# Patient Record
Sex: Female | Born: 1940 | Race: Black or African American | Hispanic: No | Smoking: Former smoker
Health system: Southern US, Community
[De-identification: ages and names within clinical notes are randomized; demographics above are authoritative.]

## PROBLEM LIST (undated history)

## (undated) DIAGNOSIS — I639 Cerebral infarction, unspecified: Secondary | ICD-10-CM

## (undated) DIAGNOSIS — N189 Chronic kidney disease, unspecified: Secondary | ICD-10-CM

## (undated) DIAGNOSIS — M4802 Spinal stenosis, cervical region: Secondary | ICD-10-CM

## (undated) DIAGNOSIS — R269 Unspecified abnormalities of gait and mobility: Secondary | ICD-10-CM

## (undated) DIAGNOSIS — F0280 Dementia in other diseases classified elsewhere without behavioral disturbance: Secondary | ICD-10-CM

## (undated) DIAGNOSIS — G4700 Insomnia, unspecified: Secondary | ICD-10-CM

## (undated) HISTORY — PX: ABDOMINAL HYSTERECTOMY: SHX81

---

## 2005-05-17 HISTORY — PX: COLON SURGERY: SHX602

## 2016-01-26 DIAGNOSIS — I10 Essential (primary) hypertension: Secondary | ICD-10-CM | POA: Insufficient documentation

## 2016-01-26 DIAGNOSIS — K219 Gastro-esophageal reflux disease without esophagitis: Secondary | ICD-10-CM | POA: Insufficient documentation

## 2016-01-26 DIAGNOSIS — G309 Alzheimer's disease, unspecified: Secondary | ICD-10-CM | POA: Insufficient documentation

## 2016-01-26 DIAGNOSIS — R55 Syncope and collapse: Secondary | ICD-10-CM | POA: Insufficient documentation

## 2016-01-26 DIAGNOSIS — E785 Hyperlipidemia, unspecified: Secondary | ICD-10-CM | POA: Insufficient documentation

## 2016-01-26 DIAGNOSIS — N390 Urinary tract infection, site not specified: Secondary | ICD-10-CM | POA: Insufficient documentation

## 2016-01-27 DIAGNOSIS — Z7189 Other specified counseling: Secondary | ICD-10-CM | POA: Insufficient documentation

## 2016-01-27 DIAGNOSIS — E538 Deficiency of other specified B group vitamins: Secondary | ICD-10-CM | POA: Insufficient documentation

## 2016-01-27 DIAGNOSIS — Z5111 Encounter for antineoplastic chemotherapy: Secondary | ICD-10-CM | POA: Insufficient documentation

## 2020-08-15 DIAGNOSIS — Z8616 Personal history of COVID-19: Secondary | ICD-10-CM

## 2020-08-15 HISTORY — DX: Personal history of COVID-19: Z86.16

## 2020-08-19 ENCOUNTER — Emergency Department (HOSPITAL_COMMUNITY): Payer: Medicare PPO

## 2020-08-19 ENCOUNTER — Other Ambulatory Visit: Payer: Self-pay

## 2020-08-19 ENCOUNTER — Emergency Department (HOSPITAL_COMMUNITY)
Admission: EM | Admit: 2020-08-19 | Discharge: 2020-08-21 | Disposition: A | Discharge status: Home / Self Care | Payer: Medicare PPO | Attending: Emergency Medicine | Admitting: Emergency Medicine

## 2020-08-19 ENCOUNTER — Encounter (HOSPITAL_COMMUNITY): Payer: Self-pay | Admitting: Emergency Medicine

## 2020-08-19 DIAGNOSIS — F0391 Unspecified dementia with behavioral disturbance: Secondary | ICD-10-CM | POA: Insufficient documentation

## 2020-08-19 DIAGNOSIS — W19XXXA Unspecified fall, initial encounter: Secondary | ICD-10-CM | POA: Diagnosis not present

## 2020-08-19 DIAGNOSIS — Z87891 Personal history of nicotine dependence: Secondary | ICD-10-CM | POA: Insufficient documentation

## 2020-08-19 DIAGNOSIS — R262 Difficulty in walking, not elsewhere classified: Secondary | ICD-10-CM

## 2020-08-19 DIAGNOSIS — Z85038 Personal history of other malignant neoplasm of large intestine: Secondary | ICD-10-CM | POA: Diagnosis not present

## 2020-08-19 DIAGNOSIS — I10 Essential (primary) hypertension: Secondary | ICD-10-CM | POA: Insufficient documentation

## 2020-08-19 DIAGNOSIS — R2689 Other abnormalities of gait and mobility: Secondary | ICD-10-CM | POA: Diagnosis not present

## 2020-08-19 DIAGNOSIS — U071 COVID-19: Secondary | ICD-10-CM | POA: Insufficient documentation

## 2020-08-19 DIAGNOSIS — Z79899 Other long term (current) drug therapy: Secondary | ICD-10-CM | POA: Insufficient documentation

## 2020-08-19 DIAGNOSIS — Y92003 Bedroom of unspecified non-institutional (private) residence as the place of occurrence of the external cause: Secondary | ICD-10-CM | POA: Diagnosis not present

## 2020-08-19 DIAGNOSIS — R296 Repeated falls: Secondary | ICD-10-CM | POA: Insufficient documentation

## 2020-08-19 DIAGNOSIS — F0390 Unspecified dementia without behavioral disturbance: Secondary | ICD-10-CM

## 2020-08-19 HISTORY — DX: Dementia in other diseases classified elsewhere, unspecified severity, without behavioral disturbance, psychotic disturbance, mood disturbance, and anxiety: F02.80

## 2020-08-19 LAB — COMPREHENSIVE METABOLIC PANEL
ALT: 17 U/L (ref 0–44)
AST: 42 U/L — ABNORMAL HIGH (ref 15–41)
Albumin: 4.1 g/dL (ref 3.5–5.0)
Alkaline Phosphatase: 61 U/L (ref 38–126)
Anion gap: 9 (ref 5–15)
BUN: 20 mg/dL (ref 8–23)
CO2: 28 mmol/L (ref 22–32)
Calcium: 10.7 mg/dL — ABNORMAL HIGH (ref 8.9–10.3)
Chloride: 103 mmol/L (ref 98–111)
Creatinine, Ser: 1.23 mg/dL — ABNORMAL HIGH (ref 0.44–1.00)
GFR, Estimated: 45 mL/min — ABNORMAL LOW (ref >60)
Glucose, Bld: 116 mg/dL — ABNORMAL HIGH (ref 70–99)
Potassium: 3.9 mmol/L (ref 3.5–5.1)
Sodium: 140 mmol/L (ref 135–145)
Total Bilirubin: 1.3 mg/dL — ABNORMAL HIGH (ref 0.3–1.2)
Total Protein: 8.2 g/dL — ABNORMAL HIGH (ref 6.5–8.1)

## 2020-08-19 LAB — CBC WITH DIFFERENTIAL/PLATELET
Abs Immature Granulocytes: 0.05 10*3/uL (ref 0.00–0.07)
Basophils Absolute: 0 10*3/uL (ref 0.0–0.1)
Basophils Relative: 0 %
Eosinophils Absolute: 0 10*3/uL (ref 0.0–0.5)
Eosinophils Relative: 0 %
HCT: 41.2 % (ref 36.0–46.0)
Hemoglobin: 13.9 g/dL (ref 12.0–15.0)
Immature Granulocytes: 1 %
Lymphocytes Relative: 16 %
Lymphs Abs: 1.7 10*3/uL (ref 0.7–4.0)
MCH: 28.4 pg (ref 26.0–34.0)
MCHC: 33.7 g/dL (ref 30.0–36.0)
MCV: 84.3 fL (ref 80.0–100.0)
Monocytes Absolute: 1.6 10*3/uL — ABNORMAL HIGH (ref 0.1–1.0)
Monocytes Relative: 15 %
Neutro Abs: 7.5 10*3/uL (ref 1.7–7.7)
Neutrophils Relative %: 68 %
Platelets: 226 10*3/uL (ref 150–400)
RBC: 4.89 MIL/uL (ref 3.87–5.11)
RDW: 16.2 % — ABNORMAL HIGH (ref 11.5–15.5)
WBC: 10.9 10*3/uL — ABNORMAL HIGH (ref 4.0–10.5)
nRBC: 0 % (ref 0.0–0.2)

## 2020-08-19 LAB — URINALYSIS, ROUTINE W REFLEX MICROSCOPIC
Bacteria, UA: NONE SEEN
Bilirubin Urine: NEGATIVE
Glucose, UA: NEGATIVE mg/dL
Ketones, ur: 5 mg/dL — AB
Leukocytes,Ua: NEGATIVE
Nitrite: NEGATIVE
Protein, ur: 30 mg/dL — AB
Specific Gravity, Urine: 1.014 (ref 1.005–1.030)
pH: 5 (ref 5.0–8.0)

## 2020-08-19 MED ORDER — SODIUM CHLORIDE 0.9 % IV BOLUS: 1000.0000 mL | Freq: Once | INTRAVENOUS | Status: DC

## 2020-08-19 MED ORDER — LORAZEPAM 1 MG PO TABS
1.0000 mg | ORAL_TABLET | Freq: Every day | ORAL | Status: DC
  Administered 2020-08-20 – 2020-08-21 (×2): 1 mg via ORAL
  Filled 2020-08-19 (×2): qty 1

## 2020-08-19 MED ORDER — HYDROCHLOROTHIAZIDE 25 MG PO TABS
25.0000 mg | ORAL_TABLET | Freq: Every day | ORAL | Status: DC
  Administered 2020-08-20 – 2020-08-21 (×2): 25 mg via ORAL
  Filled 2020-08-19 (×2): qty 1

## 2020-08-19 MED ORDER — DIVALPROEX SODIUM ER 500 MG PO TB24
500.0000 mg | ORAL_TABLET | Freq: Every day | ORAL | Status: DC
  Administered 2020-08-20 – 2020-08-21 (×2): 500 mg via ORAL
  Filled 2020-08-19 (×2): qty 1

## 2020-08-19 MED ORDER — OLANZAPINE 5 MG PO TABS
5.0000 mg | ORAL_TABLET | Freq: Every day | ORAL | Status: DC
  Administered 2020-08-20 (×2): 5 mg via ORAL
  Filled 2020-08-19 (×2): qty 1

## 2020-08-19 MED ORDER — DONEPEZIL HCL 5 MG PO TABS
5.0000 mg | ORAL_TABLET | Freq: Every day | ORAL | Status: DC
  Administered 2020-08-20 (×2): 5 mg via ORAL
  Filled 2020-08-19 (×2): qty 1

## 2020-08-19 MED ORDER — LISINOPRIL 20 MG PO TABS
20.0000 mg | ORAL_TABLET | Freq: Every day | ORAL | Status: DC
  Administered 2020-08-20 – 2020-08-21 (×2): 20 mg via ORAL
  Filled 2020-08-19 (×2): qty 1

## 2020-08-19 MED ORDER — LISINOPRIL-HYDROCHLOROTHIAZIDE 20-25 MG PO TABS: 1.0000 | ORAL_TABLET | Freq: Every day | ORAL | Status: DC

## 2020-08-19 NOTE — ED Provider Notes (Signed)
[place] DEPT Provider Note   CSN: [id number] Arrival date & time: 08/19/20  1534     History Chief Complaint  Patient presents with  . Fall    Traci Powell is a 80 y.o. female.  Pt presents to the ED today for multiple falls.  Pt has a hx of dementia and her family member gives the hx.  Pt fell on Sunday, 4/3 and today.  Today, she fell in her bedroom and was on the ground about 1-2 hours before the family member realized it.  Pt has not been wanting to walk since she fell today.          Past Medical History:  Diagnosis Date  . Alzheimer's dementia [facility])     Past Medical History:  Diagnosis Date  . Colon cancer (CMS/HCC) ([place])  . Dementia  . GERD (gastroesophageal reflux disease)  . Hyperlipidemia  . Hypertension    SurgHx:  ABDOMINAL SURGERY  . HYSTERECTOMY  . TONSILLECTOMY SURG HX    OB History   No obstetric history on file.     No family history on file.    Family History  Problem Relation Age of Onset  . Hyperlipidemia Mother  . Heart disease Father  . Diabetes Father   Tobacco Use  . Smoking status: Former Smoker  Last attempt to quit: 01/24/2005  Years since quitting: 12.9  . Smokeless tobacco: Never Used  Substance Use Topics  . Alcohol use: No  . Drug use: No      Home Medications Prior to Admission medications   Medication Sig Start Date End Date Taking? Authorizing Provider  divalproex (DEPAKOTE ER) 500 MG 24 hr tablet Take 500 mg by mouth daily.   Yes [provider]  donepezil (ARICEPT) 5 MG tablet Take 5 mg by mouth at bedtime.   Yes [provider]  lisinopril-hydrochlorothiazide (ZESTORETIC) 20-25 MG tablet Take 1 tablet by mouth daily. 01/07/16  Yes [provider]  LORazepam (ATIVAN) 1 MG tablet Take 1 mg by mouth daily.   Yes [provider]  OLANZapine (ZYPREXA) 5 MG tablet Take 5 mg by mouth at bedtime.   Yes [provider]  Vitamin D,  Ergocalciferol, (DRISDOL) 1.25 MG (50000 UNIT) CAPS capsule Take 50,000 Units by mouth every 7 (seven) days.   Yes [provider]    Allergies    Patient has no known allergies.  Review of Systems   Review of Systems  Neurological: Positive for weakness.  All other systems reviewed and are negative.   Physical Exam Updated Vital Signs BP (!) 164/90 (BP Location: Right Arm)   Pulse 66   Temp 98 F (36.7 C) (Oral)   Resp 16   SpO2 100%   Physical Exam Vitals and nursing note reviewed.  Constitutional:      Appearance: Normal appearance.  HENT:     Head: Normocephalic and atraumatic.     Right Ear: External ear normal.     Left Ear: External ear normal.     Nose: Nose normal.     Mouth/Throat:     Mouth: Mucous membranes are moist.     Pharynx: Oropharynx is clear.  Eyes:     Extraocular Movements: Extraocular movements intact.     Conjunctiva/sclera: Conjunctivae normal.     Pupils: Pupils are equal, round, and reactive to light.  Cardiovascular:     Rate and Rhythm: Normal rate and regular rhythm.     Pulses: Normal pulses.  Heart sounds: Normal heart sounds.  Pulmonary:     Effort: Pulmonary effort is normal.     Breath sounds: Normal breath sounds.  Abdominal:     General: Abdomen is flat. Bowel sounds are normal.     Palpations: Abdomen is soft.  Musculoskeletal:        General: Normal range of motion.     Cervical back: Normal range of motion and neck supple.  Skin:    General: Skin is warm.     Capillary Refill: Capillary refill takes less than 2 seconds.  Neurological:     Mental Status: She is alert. Mental status is at baseline.  Psychiatric:        Mood and Affect: Mood normal.        Behavior: Behavior normal.     ED Results / Procedures / Treatments   Labs (all labs ordered are listed, but only abnormal results are displayed) Labs Reviewed  CBC WITH DIFFERENTIAL/PLATELET - Abnormal; Notable for the following components:       Result Value   WBC 10.9 (*)    RDW 16.2 (*)    Monocytes Absolute 1.6 (*)    All other components within normal limits  URINALYSIS, ROUTINE W REFLEX MICROSCOPIC - Abnormal; Notable for the following components:   Hgb urine dipstick MODERATE (*)    Ketones, ur 5 (*)    Protein, ur 30 (*)    All other components within normal limits  COMPREHENSIVE METABOLIC PANEL - Abnormal; Notable for the following components:   Glucose, Bld 116 (*)    Creatinine, Ser 1.23 (*)    Calcium 10.7 (*)    Total Protein 8.2 (*)    AST 42 (*)    Total Bilirubin 1.3 (*)    GFR, Estimated 45 (*)    All other components within normal limits  VALPROIC ACID LEVEL    EKG None  Radiology CT Head Wo Contrast  Result Date: 08/19/2020 CLINICAL DATA:  Found on the floor today. EXAM: CT HEAD WITHOUT CONTRAST CT CERVICAL SPINE WITHOUT CONTRAST TECHNIQUE: Multidetector CT imaging of the head and cervical spine was performed following the standard protocol without intravenous contrast. Multiplanar CT image reconstructions of the cervical spine were also generated. COMPARISON:  None. FINDINGS: CT HEAD FINDINGS Brain: No sign of acute infarction. There is age related volume loss with chronic small-vessel ischemic changes of the hemispheric white matter. No mass, hemorrhage, hydrocephalus or extra-axial collection. Vascular: There is atherosclerotic calcification of the major vessels at the base of the brain. Skull: Negative Sinuses/Orbits: Clear/normal Other: None CT CERVICAL SPINE FINDINGS Alignment: Straightening of the normal cervical lordosis. Skull base and vertebrae: No evidence of acute fracture or subluxation. Chronic fusion from C4 through C6. Soft tissues and spinal canal: No traumatic soft tissue finding. No evidence of mass or adenopathy. Disc levels: Foramen magnum is widely patent. Ordinary osteoarthritis at the C1-2 articulation without encroachment upon the neural structures. C2-3: Endplate osteophytes and mild  bulging of the disc. Facet osteoarthritis worse on the left. Bilateral bony foraminal narrowing left more than right. C3-4: Spondylosis with endplate osteophytes and bulging of the disc. Bilateral facet osteoarthritis. Bilateral foraminal stenosis. C4 through C6: Solid union with sufficient patency of the canal and foramina. C6-7: Chronic disc space narrowing. Endplate osteophytes. Moderate canal stenosis at this level. Bilateral foraminal stenosis. C7-T1: Bilateral facet arthropathy.  Bilateral foraminal stenosis. Upper thoracic region appears unremarkable. Upper chest: Negative Other: None IMPRESSION: HEAD CT: No acute or traumatic finding. Age  related atrophy and chronic small-vessel ischemic changes. CERVICAL SPINE CT: No acute or traumatic finding. Chronic fusion from C4 through C6 with sufficient patency of the canal and foramina. Degenerative spondylosis and facet arthropathy with foraminal stenosis as outlined above. Canal stenosis at the C6-7 level. Electronically Signed   By: Nelson Chimes M.D.   On: 08/19/2020 16:36   CT Cervical Spine Wo Contrast  Result Date: 08/19/2020 CLINICAL DATA:  Found on the floor today. EXAM: CT HEAD WITHOUT CONTRAST CT CERVICAL SPINE WITHOUT CONTRAST TECHNIQUE: Multidetector CT imaging of the head and cervical spine was performed following the standard protocol without intravenous contrast. Multiplanar CT image reconstructions of the cervical spine were also generated. COMPARISON:  None. FINDINGS: CT HEAD FINDINGS Brain: No sign of acute infarction. There is age related volume loss with chronic small-vessel ischemic changes of the hemispheric white matter. No mass, hemorrhage, hydrocephalus or extra-axial collection. Vascular: There is atherosclerotic calcification of the major vessels at the base of the brain. Skull: Negative Sinuses/Orbits: Clear/normal Other: None CT CERVICAL SPINE FINDINGS Alignment: Straightening of the normal cervical lordosis. Skull base and vertebrae:  No evidence of acute fracture or subluxation. Chronic fusion from C4 through C6. Soft tissues and spinal canal: No traumatic soft tissue finding. No evidence of mass or adenopathy. Disc levels: Foramen magnum is widely patent. Ordinary osteoarthritis at the C1-2 articulation without encroachment upon the neural structures. C2-3: Endplate osteophytes and mild bulging of the disc. Facet osteoarthritis worse on the left. Bilateral bony foraminal narrowing left more than right. C3-4: Spondylosis with endplate osteophytes and bulging of the disc. Bilateral facet osteoarthritis. Bilateral foraminal stenosis. C4 through C6: Solid union with sufficient patency of the canal and foramina. C6-7: Chronic disc space narrowing. Endplate osteophytes. Moderate canal stenosis at this level. Bilateral foraminal stenosis. C7-T1: Bilateral facet arthropathy.  Bilateral foraminal stenosis. Upper thoracic region appears unremarkable. Upper chest: Negative Other: None IMPRESSION: HEAD CT: No acute or traumatic finding. Age related atrophy and chronic small-vessel ischemic changes. CERVICAL SPINE CT: No acute or traumatic finding. Chronic fusion from C4 through C6 with sufficient patency of the canal and foramina. Degenerative spondylosis and facet arthropathy with foraminal stenosis as outlined above. Canal stenosis at the C6-7 level. Electronically Signed   By: Nelson Chimes M.D.   On: 08/19/2020 16:36   DG Femur Min 2 Views Left  Result Date: 08/19/2020 CLINICAL DATA:  Left leg pain after fall. EXAM: LEFT FEMUR 2 VIEWS COMPARISON:  None. FINDINGS: There is no evidence of fracture or other focal bone lesions. Soft tissues are unremarkable. Severe degenerative changes seen involving the left hip joint. IMPRESSION: No acute abnormality seen in the left femur. Electronically Signed   By: Marijo Conception M.D.   On: 08/19/2020 16:46    Procedures Procedures   Medications Ordered in ED Medications  sodium chloride 0.9 % bolus 1,000  mL (1,000 mLs Intravenous Not Given 08/19/20 2156)  divalproex (DEPAKOTE ER) 24 hr tablet 500 mg (has no administration in time range)  donepezil (ARICEPT) tablet 5 mg (has no administration in time range)  LORazepam (ATIVAN) tablet 1 mg (has no administration in time range)  OLANZapine (ZYPREXA) tablet 5 mg (has no administration in time range)  lisinopril (ZESTRIL) tablet 20 mg (has no administration in time range)  hydrochlorothiazide (HYDRODIURIL) tablet 25 mg (has no administration in time range)    ED Course  I have reviewed the triage vital signs and the nursing notes.  Pertinent labs & imaging results that were  available during my care of the patient were reviewed by me and considered in my medical decision making (see chart for details).    MDM Rules/Calculators/A&P                          I doubt pt had a stroke.  MRI is gone now.  I offered transfer to [facility] for a [facility] tonight, but the daughter would rather she stay here and wait until the morning.  Sx started Sunday, so doing the MRI a few hrs later will not matter.  So, I ordered it to be done in the morning.  Pt's daughter said she is unable to take care of pt at her home.  She requests SW consult to help with placement in an assisted living facility.  I will consult PT and SW/CM for assistance.  Final Clinical Impression(s) / ED Diagnoses Final diagnoses:  Fall, initial encounter  Ambulatory dysfunction    Rx / DC Orders ED Discharge Orders    None       Isla Pence, MD 08/19/20 2352

## 2020-08-19 NOTE — ED Triage Notes (Signed)
Per family member, states history of dementia-states increased falls, found on floor this am-increased lethargy-complaining of left knee pain

## 2020-08-19 NOTE — ED Triage Notes (Signed)
Emergency Medicine Provider Triage Evaluation Note  Traci Powell , a 80 y.o. female  was evaluated in triage.  Pt complains of dementia, confusion, multiple falls. She was on the floor today for up to 2 hours. Visitor at bedside says that since the weekend she has been more confused than usual and not walking.  She has been complaining of pain in her right leg.  No other reported pains. Patient denies any pain. Unsure if she struck her head on the floor at any point when she fell.Marland Kitchen   Physical Exam  There were no vitals taken for this visit. Patient is awake and alert.  She is able to tell me what her name is.  She follows commands in all 4 extremities.  She is in no obvious distress.  She has mild edema on the right wrist which is chronic according to visitor.  Medical Decision Making  Medically screening exam initiated at 3:50 PM.  Appropriate orders placed.  Traci Powell was informed that the remainder of the evaluation will be completed by another provider, this initial triage assessment does not replace that evaluation, and the importance of remaining in the ED until their evaluation is complete.    Lorin Glass, [place] 08/19/20 1551

## 2020-08-19 NOTE — ED Notes (Signed)
Pt's daughter identified multiple bruises, and noted very recent (2 days) worsening of dementia level.

## 2020-08-20 ENCOUNTER — Emergency Department (HOSPITAL_COMMUNITY): Payer: Medicare PPO

## 2020-08-20 LAB — VALPROIC ACID LEVEL: Valproic Acid Lvl: 24 ug/mL — ABNORMAL LOW (ref 50.0–100.0)

## 2020-08-20 NOTE — Evaluation (Signed)
Physical Therapy Evaluation Patient Details Name: Traci Powell MRN: [id number] DOB: 1941-02-20 Today's Date: 08/20/2020   History of Present Illness  Traci Powell is a 80 y.o. female presents due to multiple falls, one Sunday 4/3 and one 4/5 and hasn't been able to ambulate since. Head CT negative for acute or traumatic finding, L femur xray negative. PMH: alzhemier's dementia, colon cancer, HTN  Clinical Impression  Pt admitted with above diagnosis. Unsure of pt's baseline, pt oriented to self and states she has never fallen but presents to ED due to 2 falls in last week. Pt currently requiring mod A with bed mobility, multimodal cues with mobility and increased time. Pt requiring min A to power up to standing, bracing BLE on front of bed and limited to 3 unsteady steps up to [facility] with bed positioned behind pt and flexed/squatted standing posture. Pt active in conversation with therapist, noted to begin chewing after therapist in room for ~15 minutes and had food in mouth, unable to swallow with cues, so spit into napkin- RN notified. Will continue to assess DME needs pending equipment at home. Pt currently with functional limitations due to the deficits listed below (see PT Problem List). Pt will benefit from skilled PT to increase their independence and safety with mobility to allow discharge to the venue listed below.       Follow Up Recommendations SNF    Equipment Recommendations  Other (comment) (pending home DME)    Recommendations for Other Services Speech consult (swallowing consult)     Precautions / Restrictions Precautions Precautions: Fall Restrictions Weight Bearing Restrictions: No      Mobility  Bed Mobility Overal bed mobility: Needs Assistance Bed Mobility: Supine to Sit;Sit to Supine  Supine to sit: Mod assist Sit to supine: Mod assist   General bed mobility comments: mod A to upright trunk into sitting and scoot out to EOB, mod A to lift BLE back into  bed and position into center of bed    Transfers Overall transfer level: Needs assistance Equipment used: 1 person hand held assist Transfers: Sit to/from Stand Sit to Stand: Min assist    General transfer comment: min A to power up, BUE assisting, BLE braced against front of bed  Ambulation/Gait Ambulation/Gait assistance: Mod assist  Assistive device: 1 person hand held assist  General Gait Details: pt able to take 3 sidesteps up to EOB with bed positioned behind pt, unsteady, bil knee flexion  Stairs            Wheelchair Mobility    Modified Rankin (Stroke Patients Only)       Balance Overall balance assessment: Needs assistance;History of Falls Sitting-balance support: Feet supported;Bilateral upper extremity supported Sitting balance-Leahy Scale: Poor Sitting balance - Comments: reliant on UE support   Standing balance support: During functional activity;Bilateral upper extremity supported Standing balance-Leahy Scale: Poor Standing balance comment: mod A, reliant on UE support        Pertinent Vitals/Pain Pain Assessment: No/denies pain    Home Living Family/patient expects to be discharged to:: Assisted living (per chart review, daughter wants ALF)  Additional Comments: Pt oriented to self only, unreliable responses    Prior Function Level of Independence: Independent  Comments: Pt states independent without AD, denies any falls ever, though is in ED due to recent falls so unreliable responses     Hand Dominance        Extremity/Trunk Assessment   Upper Extremity Assessment Upper Extremity Assessment: Generalized weakness  Lower Extremity Assessment Lower Extremity Assessment: Generalized weakness (AROM WNL, symmetrical, denies numbness/tingling throughout)    Cervical / Trunk Assessment Cervical / Trunk Assessment: Kyphotic  Communication   Communication: No difficulties  Cognition Arousal/Alertness: Awake/alert Behavior During  Therapy: WFL for tasks assessed/performed Overall Cognitive Status: History of cognitive impairments - at baseline    General Comments: Pt states year is "71 something", age is "26", and has 3 kids at home in highschool and college.      General Comments      Exercises     Assessment/Plan    PT Assessment Patient needs continued PT services  PT Problem List Decreased strength;Decreased activity tolerance;Decreased balance;Decreased mobility;Decreased cognition;Decreased knowledge of use of DME;Decreased safety awareness       PT Treatment Interventions DME instruction;Gait training;Functional mobility training;Therapeutic activities;Therapeutic exercise;Balance training;Neuromuscular re-education;Patient/family education    PT Goals (Current goals can be found in the Care Plan section)  Acute Rehab PT Goals Patient Stated Goal: none stated PT Goal Formulation: With patient Time For Goal Achievement: 09/03/20 Potential to Achieve Goals: Good    Frequency Min 2X/week   Barriers to discharge        Co-evaluation               AM-PAC PT "6 Clicks" Mobility  Outcome Measure Help needed turning from your back to your side while in a flat bed without using bedrails?: A Lot Help needed moving from lying on your back to sitting on the side of a flat bed without using bedrails?: A Lot Help needed moving to and from a bed to a chair (including a wheelchair)?: A Lot Help needed standing up from a chair using your arms (e.g., wheelchair or bedside chair)?: A Lot Help needed to walk in hospital room?: A Lot Help needed climbing 3-5 steps with a railing? : Total 6 Click Score: 11    End of Session Equipment Utilized During Treatment: Gait belt Activity Tolerance: Patient tolerated treatment well Patient left: in bed;with call bell/phone within reach Nurse Communication: Mobility status PT Visit Diagnosis: Unsteadiness on feet (R26.81);Other abnormalities of gait and mobility  (R26.89);Muscle weakness (generalized) (M62.81);History of falling (Z91.81)    Time: 7654-6503 PT Time Calculation (min) (ACUTE ONLY): 19 min   Charges:   PT Evaluation $PT Eval Low Complexity: 1 Low           Tori Elisabella Hacker PT, DPT 08/20/20, 10:05 AM

## 2020-08-20 NOTE — ED Notes (Signed)
Family called nurse to make aware that she will be visiting patient

## 2020-08-20 NOTE — ED Notes (Signed)
Pt cleaned up and changed, resting in bed, no signs of distress.

## 2020-08-20 NOTE — TOC Progression Note (Signed)
Transition of Care [facility]) - Progression Note    Patient Details  Name: Traci Powell MRN: [id number] Date of Birth: [birth year]  Transition of Care [facility]) CM/SW Contact  Joanne Chars, LCSW Phone Number: 08/20/2020, 5:50 PM  Clinical Narrative:  Level 2 passr docs uploaded to [facility].  Dr Kathrynn Humble will order covid test.       Barriers to Discharge: No Barriers Identified  Expected Discharge Plan and Services                                                 Social Determinants of Health (SDOH) Interventions    Readmission Risk Interventions No flowsheet data found.

## 2020-08-20 NOTE — ED Notes (Signed)
Daughter in room 

## 2020-08-20 NOTE — ED Notes (Signed)
PT in room at this time

## 2020-08-20 NOTE — ED Notes (Signed)
In MRI

## 2020-08-20 NOTE — Progress Notes (Addendum)
TOC CM spoke to Dtr, Ivin Booty and explained Traci Powell, [place], [place] and [facility] accepted referral for SNF. Dtr wanted [place]. [place] and accepted for SNF. Requested dtr contact Medicare to make insurance primary. Contacted [employer] [place] [facility], auth started, reference number [id number]. Faxed clinicals to [employer].  [place], [facility] ED TOC CM [phone]

## 2020-08-20 NOTE — Care Plan (Signed)
Called Daughter x 2. Left VM. Waiting for return phone call. Once daughter calls back and we can screen the pt surgically we will send for the pt to come to MRI.

## 2020-08-20 NOTE — Progress Notes (Addendum)
..  Transition of Care [facility]) - Emergency Department Mini Assessment   Patient Details  Name: Traci Powell MRN: [id number] Date of Birth: 1940/10/06  Transition [facility]) CM/SW Contact:    Gaetano Hawthorne Powell, LCSWA Phone Number: 08/20/2020, 10:52 AM   Clinical Narrative: TOC CSW consulted with pts daughter/Traci Powell [phone].  Pt has been fully vaccinated, no booster.  Family gave permission for pt to be faxed out for bed offer.  Traci Powell would prefer [place] or [place], [place].  Pts son lives in [place].  Traci Powell, MSW, LCSW-A Pronouns:  She, Her, Hers                  Traci Powell ED Transitions of CareClinical Social Worker [email or web address] [phone]   ED Mini Assessment: What brought you to the Emergency Department? : Fall  Barriers to Discharge: No Barriers Identified     Means of departure: Ambulance  Interventions which prevented an admission or readmission: SNF Placement    Patient Contact and Communications Key Contact 1: Traci Powell   Spoke with: Traci Powell Date: 08/20/20,     Contact Phone Number: [phone] Call outcome: Traci Powell would like memory care  w/ PT and OT.  Patient states their goals for this hospitalization and ongoing recovery are:: To get assistance with SNF. CMS Medicare.gov Compare Post Acute Care list provided to:: Patient Choice offered to / list presented to : Adult Children  Admission diagnosis:  Fall There are no problems to display for this patient.  PCP:  Pcp, No Pharmacy:   [id number] - [place], [address] [place] [address] Phone: [phone] Fax: [phone]

## 2020-08-20 NOTE — ED Notes (Signed)
Pt had large BM, linens and adult brief changed, peri area cleansed, all performed by NT [place].

## 2020-08-20 NOTE — ED Provider Notes (Signed)
Emergency Medicine Observation Re-evaluation Note  Traci Powell is a 80 y.o. female, seen on rounds today.  Pt initially presented to the ED for complaints of Fall Currently, the patient is calm.  Physical Exam  BP 135/78 (BP Location: Right Arm)   Pulse 74   Temp 97.9 F (36.6 C) (Oral)   Resp 16   SpO2 100%  Physical Exam General: no stress Cardiac: regular rate Lungs: no resp distress Psych: calm  ED Course / MDM  EKG:   I have reviewed the labs performed to date as well as medications administered while in observation.  Recent changes in the last 24 hours include -  Seeking placement.  Plan  Current plan is for patient to be placed in rehab.  I have signed paperwork specifying that patient will be admitted for rehab only.  We do not anticipate patient requiring more than 30-day stay at the facility.  Social work team informed me that they have notified staff about this contingency and that, and the need for them to seek out other resources if patient needs further stay.  Patient is not under full IVC at this time.   Varney Biles, MD 08/20/20 1718

## 2020-08-20 NOTE — NC FL2 (Signed)
[place] LEVEL OF CARE SCREENING TOOL     IDENTIFICATION  Patient Name: Traci Powell Birthdate: [birth year] Sex: female Admission Date (Current Location): 08/19/2020  [facility] and [place] Number:  Herbalist and Address:  [facility],  [place] [address] [place]      Provider Number: [id number]  Attending Physician Name and Address:  Default, Provider, MD  Relative Name and Phone Number:  Jayelyn Barno [phone]    Current Level of Care:   Recommended Level of Care: [place] Prior Approval Number:    Date Approved/Denied:   PASRR Number:    Discharge Plan: SNF    Current Diagnoses: There are no problems to display for this patient.   Orientation RESPIRATION BLADDER Height & Weight     Self,Place  Normal Continent Weight:   Height:     BEHAVIORAL SYMPTOMS/MOOD NEUROLOGICAL BOWEL NUTRITION STATUS      Continent Diet (Regular)  AMBULATORY STATUS COMMUNICATION OF NEEDS Skin   Limited Assist Verbally Normal                       Personal Care Assistance Level of Assistance  Bathing,Feeding,Dressing Bathing Assistance: Limited assistance Feeding assistance: Independent Dressing Assistance: Limited assistance     Functional Limitations Info  Sight,Hearing,Speech Sight Info: Adequate Hearing Info: Adequate Speech Info: Adequate    SPECIAL CARE FACTORS FREQUENCY  PT (By licensed PT),OT (By licensed OT)     PT Frequency: 5x a week OT Frequency: 5x a week            Contractures Contractures Info: Not present    Additional Factors Info  Code Status,Allergies Code Status Info: Full Code Allergies Info: NKA           Current Medications (08/20/2020):  This is the current hospital active medication list Current Facility-Administered Medications  Medication Dose Route Frequency Provider Last Rate Last Admin  . divalproex (DEPAKOTE ER) 24 hr tablet 500 mg  500 mg Oral Daily  Isla Pence, MD   500 mg at 08/20/20 0949  . donepezil (ARICEPT) tablet 5 mg  5 mg Oral QHS Isla Pence, MD   5 mg at 08/20/20 0127  . hydrochlorothiazide (HYDRODIURIL) tablet 25 mg  25 mg Oral Daily Isla Pence, MD   25 mg at 08/20/20 0949  . lisinopril (ZESTRIL) tablet 20 mg  20 mg Oral Daily Isla Pence, MD   20 mg at 08/20/20 0949  . LORazepam (ATIVAN) tablet 1 mg  1 mg Oral Daily Isla Pence, MD   1 mg at 08/20/20 0950  . OLANZapine (ZYPREXA) tablet 5 mg  5 mg Oral QHS Isla Pence, MD   5 mg at 08/20/20 0127  . sodium chloride 0.9 % bolus 1,000 mL  1,000 mL Intravenous Once Isla Pence, MD       Current Outpatient Medications  Medication Sig Dispense Refill  . divalproex (DEPAKOTE ER) 500 MG 24 hr tablet Take 500 mg by mouth daily.    Marland Kitchen donepezil (ARICEPT) 5 MG tablet Take 5 mg by mouth at bedtime.    Marland Kitchen lisinopril-hydrochlorothiazide (ZESTORETIC) 20-25 MG tablet Take 1 tablet by mouth daily.    Marland Kitchen LORazepam (ATIVAN) 1 MG tablet Take 1 mg by mouth daily.    Marland Kitchen OLANZapine (ZYPREXA) 5 MG tablet Take 5 mg by mouth at bedtime.    . Vitamin D, Ergocalciferol, (DRISDOL) 1.25 MG (50000 UNIT) CAPS capsule Take 50,000 Units by mouth  every 7 (seven) days.       Discharge Medications: Please see discharge summary for a list of discharge medications.  Relevant Imaging Results:  Relevant Lab Results:   Additional Information SSN#:  [id number]  Elisse Pennick C Tarpley-Carter, LCSWA

## 2020-08-21 LAB — SARS CORONAVIRUS 2 (TAT 6-24 HRS): SARS Coronavirus 2: POSITIVE — AB

## 2020-08-21 NOTE — ED Notes (Signed)
Daughter of patient, Leilene Diprima would like nurse to call her back with update [phone].

## 2020-08-21 NOTE — Progress Notes (Signed)
TOC CSW attempted to contact [facility] [phone].  CSW left HIPPA compliant message with my contact information.  CSW will continue to follow for dc needs.  Ramil Edgington Tarpley-Carter, MSW, LCSW-A Pronouns:  She, Her, Hers                  [place] ED Transitions of CareClinical Social Worker [email or web address] [phone]

## 2020-08-21 NOTE — NC FL2 (Signed)
[place] LEVEL OF CARE SCREENING TOOL     IDENTIFICATION  Patient Name: Traci Powell Birthdate: 10/13/1940 Sex: female Admission Date (Current Location): 08/19/2020  [facility] and [place] Number:  Herbalist and Address:  [facility],  [place] [address], [place]      Provider Number: [id number]  Attending Physician Name and Address:  Default, Provider, MD  Relative Name and Phone Number:  Emarie Paul [phone]    Current Level of Care: Hospital Recommended Level of Care: [place] Prior Approval Number:    Date Approved/Denied:   PASRR Number:    Discharge Plan: SNF    Current Diagnoses: There are no problems to display for this patient.   Orientation RESPIRATION BLADDER Height & Weight     Self,Place  Normal Continent Weight:   Height:     BEHAVIORAL SYMPTOMS/MOOD NEUROLOGICAL BOWEL NUTRITION STATUS      Continent Diet (Regular)  AMBULATORY STATUS COMMUNICATION OF NEEDS Skin   Limited Assist Verbally Normal                       Personal Care Assistance Level of Assistance  Bathing,Feeding,Dressing Bathing Assistance: Limited assistance Feeding assistance: Independent Dressing Assistance: Limited assistance     Functional Limitations Info  Sight,Hearing,Speech Sight Info: Adequate Hearing Info: Adequate Speech Info: Adequate    SPECIAL CARE FACTORS FREQUENCY  PT (By licensed PT),OT (By licensed OT)     PT Frequency: 5x a week OT Frequency: 5x a week            Contractures Contractures Info: Not present    Additional Factors Info  Code Status,Allergies Code Status Info: Full Code Allergies Info: NKA           Current Medications (08/21/2020):  This is the current hospital active medication list Current Facility-Administered Medications  Medication Dose Route Frequency Provider Last Rate Last Admin  . divalproex (DEPAKOTE ER) 24 hr tablet 500 mg  500 mg Oral Daily  Isla Pence, MD   500 mg at 08/20/20 0949  . donepezil (ARICEPT) tablet 5 mg  5 mg Oral QHS Isla Pence, MD   5 mg at 08/20/20 2226  . hydrochlorothiazide (HYDRODIURIL) tablet 25 mg  25 mg Oral Daily Isla Pence, MD   25 mg at 08/20/20 0949  . lisinopril (ZESTRIL) tablet 20 mg  20 mg Oral Daily Isla Pence, MD   20 mg at 08/20/20 0949  . LORazepam (ATIVAN) tablet 1 mg  1 mg Oral Daily Isla Pence, MD   1 mg at 08/20/20 0950  . OLANZapine (ZYPREXA) tablet 5 mg  5 mg Oral QHS Isla Pence, MD   5 mg at 08/20/20 2226  . sodium chloride 0.9 % bolus 1,000 mL  1,000 mL Intravenous Once Isla Pence, MD       Current Outpatient Medications  Medication Sig Dispense Refill  . divalproex (DEPAKOTE ER) 500 MG 24 hr tablet Take 500 mg by mouth daily.    Marland Kitchen donepezil (ARICEPT) 5 MG tablet Take 5 mg by mouth at bedtime.    Marland Kitchen lisinopril-hydrochlorothiazide (ZESTORETIC) 20-25 MG tablet Take 1 tablet by mouth daily.    Marland Kitchen LORazepam (ATIVAN) 1 MG tablet Take 1 mg by mouth daily.    Marland Kitchen OLANZapine (ZYPREXA) 5 MG tablet Take 5 mg by mouth at bedtime.    . Vitamin D, Ergocalciferol, (DRISDOL) 1.25 MG (50000 UNIT) CAPS capsule Take 50,000 Units by mouth every  7 (seven) days.       Discharge Medications: Please see discharge summary for a list of discharge medications.  Relevant Imaging Results:  Relevant Lab Results:   Additional Information SSN#:  [id number]  Zymier Rodgers C Tarpley-Carter, LCSWA

## 2020-08-21 NOTE — ED Notes (Signed)
Patient received breakfast tray 

## 2020-08-21 NOTE — Progress Notes (Signed)
TOC CSW received a call from [employer] [phone].  Traci Powell does currently have Covid beds, just waiting on auth approval.  CSW will continue to follow for dc needs.  Traci Powell, MSW, LCSW-A Pronouns:  She, Her, [place] ED Transitions of CareClinical Social Worker [email or web address] [phone]

## 2020-08-21 NOTE — Progress Notes (Signed)
Traci Powell. [name]: [id number] 08/19/2020 [facility] [phone] Traci Powell (MRN: [id number]) . Printed at 08/21/2020 1:53 PM Page 1 of 20 AFTER VISIT SUMMARY 1. 2. 3. ..... It was our pleasure to provide your ER care today - we hope that you feel better. Transport patient to [facility]. Fall precautions. Follow up with primary care doctor in 1-2 weeks. Return to ER if worse, new symptoms, fevers, trouble breathing, new/severe pain, or other emergency concern. Dementia Easy-to-Read (English) Fall Prevention in the Home Adult (English) Understanding Your Risk for Falls (English) You were seen by Provider Default, MD Fall Fall, initial encounter Ambulatory dysfunction Frequent falls Dementia without behavioral disturbance, unspecified dementia type ([place]) COVID-19 CBC with Differential Comprehensive metabolic panel SARS CORONAVIRUS 2 (TAT 6-24 HRS) Nasopharyngeal Nasopharyngeal Swab Urinalysis, Routine w reflex microscopic Urine, Clean Catch Valproic acid level Read the attached information Reason for Visit Diagnoses Lab Tests Completed Today's Visit Instructions Traci Powell (MRN: [id number]) . Printed at 08/21/2020 1:53 PM Page 2 of 20 Today's Visit (continued) CT Cervical Spine Wo Contrast CT Head Wo Contrast DG Femur Min 2 Views Left MR BRAIN WO CONTRAST Consult to Transition of Care Team (SW and CM) PT PLAN OF CARE CERT/RE-CERT divalproex (DEPAKOTE ER) Last given 08/21/2020 10:19 AM donepezil (ARICEPT) Last given 08/20/2020 10:26 PM hydrochlorothiazide (HYDRODIURIL) Last given 08/20/2020 9:49 AM lisinopril (ZESTRIL) Last given 08/21/2020 10:19 AM LORazepam (ATIVAN) Last given 08/21/2020 10:19 AM OLANZapine (ZYPREXA) Last given 08/20/2020 10:26 PM You currently have no upcoming appointments scheduled. Ready to Activate Your MyChart Account? Your Activation Code is: [email or web address] Expires: 10/05/2020 1:47 PM Go  to [email or web address] or download the MyChart app. Go to the app store, search "MyChart", open the app, select , and activate your account. Need technical help? Call [phone]. For Medical questions please contact your provider. No active allergies Pcp, No Imaging Tests Done Today Medications Given You are allergic to the following You are allergic to the following Your Primary Care Provider MyChart What's Next Traci Powell (MRN: [id number]) . Printed at 08/21/2020 1:53 PM Page 3 of 20 Your Medication List divalproex 500 MG 24 hr tablet Commonly known as: DEPAKOTE ER Take 500 mg by mouth daily. donepezil 5 MG tablet Commonly known as: ARICEPT Take 5 mg by mouth at bedtime. lisinopril-hydrochlorothiazide 20-25 MG tablet Commonly known as: ZESTORETIC Take 1 tablet by mouth daily. LORazepam 1 MG tablet Commonly known as: ATIVAN Take 1 mg by mouth daily. OLANZapine 5 MG tablet Commonly known as: ZYPREXA Take 5 mg by mouth at bedtime. Vitamin D (Ergocalciferol) 1.25 MG (50000 UNIT) Caps capsule Commonly known as: DRISDOL Take 50,000 Units by mouth every 7 (seven) days. ASK your doctor about these medications ASK your doctor about these medications Traci Powell (MRN: [id number]) . Printed at 08/21/2020 1:53 PM Page 4 of 20 .... . . ...... ........ .. Dementia Dementia is a condition that affects the way the brain works. It often affects memory and thinking. There are many types of dementia. Some types get worse with time and cannot be reversed. Some types of dementia include: Alzheimer's disease. This is the most common type. Vascular dementia. This type may happen due to a stroke. Lewy body dementia. This type may happen to people who have Parkinson's disease. Frontotemporal dementia. This type is caused by damage to nerve cells in certain parts of the brain. Some people may have more than one type. What are the causes? This  condition is caused by  damage to cells in the brain. Some causes that cannot be reversed include: Having a condition that affects the blood vessels of the brain, such as diabetes, heart disease, or blood vessel disease. Changes to genes. Some causes that can be reversed or slowed include: Injury to the brain. Certain medicines. Infection. Not having enough vitamin B12 in the body, or thyroid problems. A tumor, blood clot, or too much fluid in the brain. Certain diseases that cause your body's defense system (immune system) to attack healthy parts of the body. What are the signs or symptoms? Problems remembering events or people. Having trouble taking a bath or putting clothes on. Forgetting appointments. Forgetting to pay bills. Trouble planning and making meals. Having trouble speaking. Getting lost easily. Changes in behavior or mood. How is this treated? Treatment depends on the cause of the dementia. It might include: Taking medicines for symptoms or to help control or slow down the dementia. Treating the cause of your dementia. Your doctor can help you find support groups and other doctors who can help with your care. Attached Information Dementia Easy-to-Read (English) Traci Powell (MRN: [id number]) . Printed at 08/21/2020 1:53 PM Page 5 of 20 ... . ?? ??? . ???? .. ... ... Follow these instructions at home: Medicines Take over-the-counter and prescription medicines only as told by your doctor. Use a pill organizer to help you manage your medicines. Avoidtaking medicines for pain or for sleep. Lifestyle Make healthy choices: Be active as told by your doctor. Do not smoke or use any products that contain nicotine or tobacco. If you need help quitting, ask your doctor. Do not drink alcohol. When you get stressed, do something that will help you relax. Your doctor can give you tips. Spend time with other people. Make sure you get good sleep. To get good  sleep: Try not to take naps during the day. Keep your bedroom dark and cool. In the few hours before you go to bed, try not to do any exercise. Do not have foods and drinks with caffeine at night. Eating and drinking Drink enough fluid to keep your pee (urine) pale yellow. Eat a healthy diet. Where to find more information Alzheimer's Association: CapitalMile.co.nz National Institute on Aging: DVDEnthusiasts.nl World Health Organization: RoleLink.com.br Contact a doctor if: You have any new symptoms. Your symptoms get worse. You have problems with swallowing or choking. . ?? .. .. General instructions Talk with your doctor to figure out: What you need help with. What your safety needs are. Ask your doctor if it is safe for you to drive. If told, wear a bracelet that tracks where you are or shows that you are a person with memory loss. Work with your family to make big decisions. Keep all follow-up visits. Traci Powell (MRN: [id number]) . Printed at 08/21/2020 1:53 PM Page 6 of 20 .. ... ..... Get help right away if: You feel very sad, or feel that you want to harm yourself. Your family members are worried for your safety. Get help right away if you feel like you may hurt yourself or others, or have thoughts about taking your own life. Go to your nearest emergency room or: Call your local emergency services (911 in the [place] Call the [facility] at [phone]. This is open 24 hours a day. Text the Crisis Text Line at 843-192-6656. Summary Dementia often affects memory and thinking. Some types of dementia get worse with time and cannot be reversed. Treatment for this condition  depends on the cause. Talk with your doctor to figure out what you need help with. Your doctor can help you find support groups and other doctors who can help with your care. This information is not intended to replace advice given to you by your health care provider. Make  sure you discuss any questions you have with your health care provider. Document Revised: 09/17/2019 Document Reviewed: 09/17/2019 Elsevier Patient Education  2021 [place]. Cheong (MRN: [id number]) . Printed at 08/21/2020 1:53 PM Page 7 of 20 ........ . . Fall Prevention in the Home, Adult Falls can cause injuries and can affect people from all age groups. There are many simple things that you can do to make your home safe and to help prevent falls. Ask for help when making these changes, if needed. What actions can I take to prevent falls? General instructions Use good lighting in all rooms. Replace any light bulbs that burn out. Turn on lights if it is dark. Use night-lights. Place frequently used items in easy-to-reach places. Lower the shelves around your home if necessary. Set up furniture so that there are clear paths around it. Avoid moving your furniture around. Remove throw rugs and other tripping hazards from the floor. Avoid walking on wet floors. Fix any uneven floor surfaces. Add color or contrast paint or tape to grab bars and handrails in your home. Place contrasting color strips on the first and last steps of stairways. When you use a stepladder, make sure that it is completely opened and that the sides are firmly locked. Have someone hold the ladder while you are using it. Do not climb a closed stepladder. Be aware of any and all pets. . . . . . . What can I do in the bathroom? Keep the floor dry. Immediately clean up any water that spills onto the floor. Remove soap buildup in the tub or shower on a regular basis. Use non-skid mats or decals on the floor of the tub or shower. Attach bath mats securely with double-sided, non-slip rug tape. If you need to sit down while you are in the shower, use a plastic, non-slip stool. Install grab bars by the toilet and in the tub and shower. Do not use towel bars as grab bars. Attached Information Fall  Prevention in the Home Adult (English) Traci Powell (MRN: [id number]) . Printed at 08/21/2020 1:53 PM Page 8 of 20 ... .... .. . .. .. ..... ..... ... What can I do in the bedroom? Make sure that a bedside light is easy to reach. Do not use oversized bedding that drapes onto the floor. Have a firm chair that has side arms to use for getting dressed. What can I do in the kitchen? Clean up any spills right away. If you need to reach for something above you, use a sturdy step stool that has a grab bar. Keep electrical cables out of the way. Do not use floor polish or wax that makes floors slippery. If you must use wax, make sure that it is non-skid floor wax. What can I do in the stairways? Do not leave any items on the stairs. Make sure that you have a light switch at the top of the stairs and the bottom of the stairs. Have them installed if you do not have them. Make sure that there are handrails on both sides of the stairs. Fix handrails that are broken or loose. Make sure that handrails are as long as the stairways.  Install non-slip stair treads on all stairs in your home. Avoid having throw rugs at the top or bottom of stairways, or secure the rugs with carpet tape to prevent them from moving. Choose a carpet design that does not hide the edge of steps on the stairway. Check any carpeting to make sure that it is firmly attached to the stairs. Fix any carpet that is loose or worn. What can I do on the outside of my home? Use bright outdoor lighting. Regularly repair the edges of walkways and driveways and fix any cracks. Remove high doorway thresholds. Trim any shrubbery on the main path into your home. Regularly check that handrails are securely fastened and in good repair. Both sides of any steps should have handrails. Install guardrails along the edges of any raised decks or porches. Clear walkways of debris and clutter, including tools and rocks. Have leaves, snow, and  ice cleared regularly. Use sand or salt on walkways during winter months. In the garage, clean up any spills right away, including grease or oil spills. What other actions can I take? Wear closed-toe shoes that fit well and support your feet. Wear shoes that have rubber soles or low heels. Use mobility aids as needed, such as canes, walkers, scooters, and crutches. Review your medicines with your health care provider. Some medicines can cause dizziness or changes in blood pressure, which increase your risk of falling. Traci Powell (MRN: [id number]) . Printed at 08/21/2020 1:53 PM Page 9 of 20 .. ... ... Talk with your health care provider about other ways that you can decrease your risk of falls. This may include working with a physical therapist or trainer to improve your strength, balance, and endurance. Where to find more information Centers for Disease Control and Prevention, STEADI: WebmailGuide.co.za [employer] on Aging: BrainJudge.co.uk Contact a health care provider if: You are afraid of falling at home. You feel weak, drowsy, or dizzy at home. You fall at home. Summary There are many simple things that you can do to make your home safe and to help prevent falls. Ways to make your home safe include removing tripping hazards and installing grab bars in the bathroom. Ask for help when making these changes in your home. This information is not intended to replace advice given to you by your health care provider. Make sure you discuss any questions you have with your health care provider. Document Revised: 04/15/2017 Document Reviewed: 12/16/2016 Elsevier Patient Education  2021 [place]. Powell (MRN: [id number]) . Printed at 08/21/2020 1:53 PM Page 10 of 20 .. ...... ...... ??? . Understanding Your Risk for Falls Each year, millions of people have serious injuries from falls. It is important to understand your risk for falling. Talk with your  health care provider about your risk and what you can do to lower it. There are actions you can take at home to lower your risk and prevent falls. If you do have a serious fall, make sure to tell your health care provider. Falling once raises your risk of falling again. How can falls affect me? Serious injuries from falls are common. These include: Broken bones, such as hip fractures. Head injuries, such as traumatic brain injuries (TBI) or concussion. A fear of falling can cause you to avoid activities and stay at home. This can make your muscles weaker and actually raise your risk for a fall. What can increase my risk? There are a number of risk factors that increase your risk for  falling. The more risk factors you have, the higher your risk of falling. Serious injuries from a fall happen most often to people older than age 35. Children and young adults ages 64-29 are also at higher risk. Common risk factors include: Weakness in the lower body. Lack (deficiency) of vitamin D. Being generally weak or confused due to long-term (chronic) illness. Dizziness or balance problems. Poor vision. Medicines that cause dizziness or drowsiness. These can include medicines for your blood pressure, heart, anxiety, insomnia, or edema, as well as pain medicines and muscle relaxants. Other risk factors include: Drinking alcohol. Having had a fall in the past. Having depression. Having foot pain or wearing improper footwear. Working at a dangerous job. Having any of the following in your home: Tripping hazards, such as floor clutter or loose rugs. Poor lighting. Pets. Having dementia or memory loss. Attached Information Understanding Your Risk for Falls (English) Traci Powell (MRN: [id number]) . Printed at 08/21/2020 1:53 PM Page 11 of 20 ....... Questions to ask your health care provider Can you help me check my risk for a fall? Do any of my medicines make me more likely to fall? Should I take  a vitamin D supplement? What exercises can I do to improve my strength and balance? Should I make an appointment to have my vision checked? Do I need a bone density test to check for weak bones or osteoporosis? Would it help to use a cane or a walker? ... .. .. . . .. . What actions can I take to lower my risk of falling? Physical activity Maintain physical fitness. Do strength and balance exercises. Consider taking a regular class to build strength and balance. Yoga and tai chi are good options. Vision Have your eyes checked every year and your vision prescription updated as needed. Walking aids and footwear Wear nonskid shoes. Do not wear high heels. Do not walk around the house in socks or slippers. Use a cane or walker as told by your health care provider. Home safety Attach secure railings on both sides of your stairs. Install grab bars for your tub, shower, and toilet. Use a bath mat in your tub or shower. Use good lighting in all rooms. Keep a flashlight near your bed. Make sure there is a clear path from your bed to the bathroom. Use night-lights. Do not use throw rugs. Make sure all carpeting is taped or tacked down securely. Remove all clutter from walkways and stairways, including extension cords. Repair uneven or broken steps. Avoid walking on icy or slippery surfaces. Walk on the grass instead of on icy or slick sidewalks. Use ice melt to get rid of ice on walkways. Use a cordless phone. Traci Powell. Rumer (MRN: [id number]) . Printed at 08/21/2020 1:53 PM Page 12 of 20 ... ... . ... Where to find more information Centers for Disease Control and Prevention, STEADI: http://www.wolf.info/ Community-Based Fall Prevention Programs: http://www.wolf.info/ National Institute on Aging: http://kim-miller.com/ Contact a health care provider if: You fall at home. You are afraid of falling at home. You feel weak, drowsy, or dizzy. Summary Serious injuries from a fall happen most often to  people older than age 74. Children and young adults ages 63-29 are also at higher risk. Talk with your health care provider about your risks for falling and how to lower those risks. Taking certain precautions at home can lower your risk for falling. If you fall, always tell your health care provider. This information is not intended to replace advice  given to you by your health care provider. Make sure you discuss any questions you have with your health care provider. Document Revised: 12/05/2019 Document Reviewed: 12/05/2019 Elsevier Patient Education  2021 [place] you for choosing [facility] for your medical care today. The doctors, nurses and all of the staff strive to provide you and your family members exceptional care during these trying circumstances. Emergency department and Urgent Care patients are not always seen by their assigned attending doctors. Some patients are only seen by an advanced practice provider. However, an attending physician is always available for review and involvement in your care, even if he or she doesn't personally see you during your visit. Seek immediate medical care for severe or worsening symptoms. For urgent medical matters, contact your doctor's office. If you are experiencing a true medical emergency, go to the Emergency Department or call 911. Emergency Departments provide medical screening exams and initial stabilizing treatment of emergency medical conditions. Urgent Cares provide treatment for illnesses and injuries that require medical attention but are not lifethreatening. Medicine is an Financial trader and many conditions cannot be diagnosed or completely treated during a single ED or UC visit. Your treating healthcare provider(s) today feel your condition has been stabilized so further care as To Find a Primary Care or Specialty Doctor: Call [phone] or [phone] to access [facility] Doctor service. Or  [email or web address] Additional Information Jaimi R. Dancer (MRN: [id number]) . Printed at 08/21/2020 1:53 PM Page 13 of 20 Additional Information (continued) an outpatient is reasonable. Emergency or Urgent care does not substitute for complete, ongoing, or follow-up care by your primary care physician or consultant. Your medication list was reviewed prior to treatment and again at discharge by the treating provider for the purpose of this outpatient visit only. Please review this entire medication list with your pharmacist, primary care physicians, and specialist(s). It is your responsibility to share any new medication instructions you received this visit with your doctor(s). Although no medicine is without risk, your healthcare provider today feels reasonable decisions were made concerning starting new medications and stopping or changing the dosages of your usual medications until you receive follow-up care. Take medications only as directed. Many medications can cause drowsiness, especially those for pain, anxiety, muscle spasms, nausea, and allergies. DO NOT drive, drink alcohol, operate power machinery, or participate in potentially dangerous activities if taking medicines that make you tired. If you use tobacco products, drink alcohol to excess, use street drugs, and/or abuse controlled substances, you are advised to discontinue and seek professional assistance in quitting. If your blood pressure was elevated above 135/85 this visit, you should have it repeated at your doctor within one month or as otherwise directed. If you were prescribed antibiotics and take birth control medicine, know that the birth control may not work due to the antibiotics. If you were prescribed antibiotics and take Coumadin/Warfarin, know that your blood may become even thinner while on antibiotics, so contact your doctor as soon as possible to determine if your Coumadin dose needs  adjustment. Know Your Options for fast and affordable healthcare options: Visit TrustyToys.ca National Suicide Prevention Lifeline - (505) 287-9831 Free, confidential support and resources Romayne Ticas. [place] (MRN: [id number]) . Printed at 08/21/2020 1:53 PM Page 14 of 20 .Marland Kitchen If you wish to quit smoking, help is available. For free tobacco cessation program offerings call the [facility] at (812)768-3538 or Live Well Line at 226-416-0751. You may also visit www..com or email  [email or web address] .com for more information on other programs. Care Everywhere ID: [id number] Fact Sheet for Patients And Caregivers Emergency Use Authorization (EUA) Of Molnupiravir For Coronavirus Disease 2019 (COVID-19) What is the most important information I should know about molnupiravir? Molnupiravir may cause serious side effects, including: Marland Kitchen Molnupiravir may cause harm to your unborn baby. It is not known if molnupiravir will harm your baby if you take molnupiravir during pregnancy. Molnupiravir is not recommended for use in pregnancy. Molnupiravir has not been studied in pregnancy. Molnupiravir was studied in pregnant animals only. When molnupiravir was given to pregnant animals, molnupiravir caused harm to their unborn babies. Smoking Cessation Information [facility] Urgent [place]. Prest (MRN: [id number]) . Printed at 08/21/2020 1:53 PM Page 15 of 20 . . Dennis Bast and your healthcare provider may decide that you should take molnupiravir during pregnancy if there are no other COVID-19 treatment options authorized by the FDA that are accessible or clinically appropriate for you. If you and your healthcare provider decide that you should take molnupiravir during pregnancy, you and your healthcare provider should discuss the known and potential benefits and the potential risks of taking molnupiravir during pregnancy. For  individuals who are able to become pregnant: You should use a reliable method of birth control (contraception) consistently and correctly during treatment with molnupiravir and for 4 days after the last dose of molnupiravir. Talk to your healthcare provider about reliable birth control methods. Before starting treatment with molnupiravir your healthcare provider may do a pregnancy test to see if you are pregnant before starting treatment with molnupiravir. Tell your healthcare provider right away if you become pregnant or think you may be pregnant during treatment with molnupiravir. Pregnancy Surveillance Program: There is a pregnancy surveillance program for individuals who take molnupiravir during pregnancy. The purpose of this program is to collect information about the health of you and your baby. Talk to your healthcare provider about how to take part in this program. If you take molnupiravir during pregnancy and you agree to participate in the pregnancy surveillance program and allow your healthcare provider to share your information with [place], then your healthcare provider will report your use of molnupiravir during pregnancy to [place]. by calling [phone] or [email or web address]. For individuals who are sexually active with partners who are able to become pregnant: It is not known if molnupiravir can affect sperm. While the risk is regarded as low, animal studies to fully assess the potential for molnupiravir to affect the babies of males treated with molnupiravir have not been completed. A reliable method of birth control (contraception) should be used consistently and correctly during treatment with molnupiravir and for at least 3 months after the last dose. The risk to sperm beyond 3 months is not known. Studies to understand the risk to sperm beyond 3 months are ongoing. Talk to your healthcare provider about reliable birth control  methods. Talk to your healthcare provider if you have questions or concerns about how molnupiravir may affect sperm. You are being given this fact sheet because your healthcare provider believes it is necessary to provide you with molnupiravir for the treatment of adults with mild-to-moderate coronavirus disease 2019 (COVID-19) with positive results of direct SARS-CoV-2 viral testing, and who are at high risk for progressing to severe COVID-19 including hospitalization or death, andfor whom other COVID-19 treatment options authorized by the FDA are not accessible or clinically appropriate. The U.S. Food  and Drug Administration (FDA) has issued an Emergency Use Authorization (EUA) to make molnupiravir available during the COVID-19 pandemic (for more details about an EUA please see "What is an Emergency Use Authorization?" at the end of this document). Molnupiravir is not an FDA-approved medicine in the [place]. Read this Fact Sheet for information about molnupiravir. Talk to your healthcare provider about your options if you have any questions. It is your choice to take molnupiravir. What is COVID-19? COVID-19 is caused by a virus called a coronavirus. You can get COVID-19 through close contact with another person who has the virus. COVID-19 illnesses have ranged from very mild-to-severe, including illness resulting in death. While information so far suggests that most COVID-19 illness is mild, serious illness can happen and may cause some of your other medical conditions to become worse. Older people and people of all ages with severe, long lasting (chronic) medical conditions like heart disease, lung disease and diabetes, for example seem to be at higher risk of being hospitalized for COVID-19. Traci Powell. Daris (MRN: [id number]) . Printed at 08/21/2020 1:53 PM Page 16 of 20 .. ... .. . . .. . .... What is molnupiravir? Molnupiravir is an investigational medicine used to treat  mild-to-moderate COVID-19 in adults: with positive results of direct SARS-CoV-2 viral testing, and who are at high risk for progressing to severe COVID-19 including hospitalization or death, and for whom other COVID-19 treatment options authorized by the FDA are not accessible or clinically appropriate. The FDA has authorized the emergency use of molnupiravir for the treatment of mild-tomoderate COVID-19 in adults under an EUA. For more information on EUA, see the "What is an Emergency Use Authorization (EUA)?" section at the end of this Fact Sheet. Molnupiravir is not authorized: . for use in people less than 57 years of age. . for prevention of COVID-19. . for people needing hospitalization for COVID-19. . for use for longer than 5 consecutive days. What should I tell my healthcare provider before I take molnupiravir? Tell your healthcare provider if you: . Have any allergies . Are breastfeeding or plan to breastfeed . Have any serious illnesses . Are taking any medicines (prescription, over-the-counter, vitamins, or herbal products). How do I take molnupiravir? Take molnupiravir exactly as your healthcare provider tells you to take it. Take 4 capsules of molnupiravir every 12 hours (for example, at 8 am and at 8 pm) Take molnupiravir for 5 days. It is important that you complete the full 5 days of treatment with molnupiravir. Do not stop taking molnupiravir before you complete the full 5 days of treatment, even if you feel better. Take molnupiravir with or without food. You should stay in isolation for as long as your healthcare provider tells you to. Talk to your healthcare provider if you are not sure about how to properly isolate while you have COVID-19. Swallow molnupiravir capsules whole. Do not open, break, or crush the capsules. If you cannot swallow capsules whole, tell your healthcare provider. What to do if you miss a dose: If it has been less than 10 hours since the missed  dose, take it as soon as you remember If it has been more than 10 hours since the missed dose, skip the missed dose and take your dose at the next scheduled time. Do not double the dose of molnupiravir to make up for a missed dose. What are the important possible side effects of molnupiravir? Possible side effects of molnupiravir are: See, "What is the most important information  I should know about molnupiravir?" diarrhea nausea dizziness These are not all the possible side effects of molnupiravir. Not many people have taken molnupiravir. Serious and unexpected side effects may happen. This medicine is still being studied, so it is possible that all of the risks are not known at this time. Traci Powell. Matura (MRN: [id number]) . Printed at 08/21/2020 1:53 PM Page 17 of 20 .. ..... What other treatment choices are there? Like molnupiravir, FDA may allow for the emergency use of other medicines to treat people with COVID-19. Go to https:// ListingMagazine.si for more information. It is your choice to be treated or not to be treated with molnupiravir. Should you decide not to take it, it will not change your standard medical care. What if I am breastfeeding? Breastfeeding is not recommended during treatment with molnupiravir and for 4 days after the last dose of molnupiravir. If you are breastfeeding or plan to breastfeed, talk to your healthcare provider about your options and specific situation before taking molnupiravir. How do I report side effects with molnupiravir? Contact your healthcare provider if you have any side effects that bother you or do not go away. Report side effects to FDA MedWatch at SmoothHits.hu or call 1-800-FDA-1088 (1-(757)206-3737). How should I store [place]? Store molnupiravir capsules at room temperature between 90F to 65F (20C to 25C). Keep molnupiravir  and all medicines out of the reach of children and pets. How can I learn more about COVID-19? Ask your healthcare provider. Visit SeekRooms.co.uk Contact your local or state public health department. Call [place] at [phone] (toll free in the [place] Visit www.molnupiravir.com What Is an Emergency Use Authorization (EUA)? The [place] FDA has made molnupiravir available under an emergency access mechanism called an Emergency Use Authorization (EUA) The EUA is supported by a Education officer, museum and Human Service (HHS) declaration that circumstances exist to justify emergency use of drugs and biological products during the COVID-19 pandemic. Molnupiravir for the treatment of mild-to-moderate COVID-19 in adults with positive results of direct SARS-CoV-2 viral testing, who are at high risk for progression to severe COVID-19, including hospitalization or death, and for whom alternative COVID-19 treatment options authorized by FDA are not accessible or clinically appropriate, has not undergone the same type of review as an FDA-approved product. In issuing an EUA under the GHWEX-93 public health emergency, the FDA has determined, among other things, that based on the total amount of scientific evidence available including data from adequate and well-controlled clinical trials, if available, it is reasonable to believe that the product may be effective for diagnosing, treating, or preventing COVID-19, or a serious or life-threatening disease or condition caused by COVID19; that the known and potential benefits of the product, when used to diagnose, treat, or prevent such disease or condition, outweigh the known and potential risks of such product; and that there are no adequate, approved, and available alternatives. All of these criteria must be met to allow for the product to be used in the treatment of patients during the COVID-19 pandemic. The EUA for molnupiravir is in  effect for the duration of the COVID-19 declaration justifying emergency use of molnupiravir, unless terminated or revoked (after which molnupiravir may no longer be used under the EUA). This fact sheet can be found at: RunningShows.fr For patent information: http://rogers.info/ Copyright  2021 [place] [place], [place] and its affiliates. Traci Powell. Irigoyen (MRN: [id number]) . Printed at 08/21/2020 1:53 PM Page 18 of 20 . . . . ???????????  All rights reserved. [id number] Issued: 05/08/2020 3 Key Steps to Take While Waiting for Your COVID-19 Test Result To help stop the spread of COVID-19, take these 3 key steps NOW while waiting for your test results: 1. Stay home and monitor your health. Stay home and monitor your health to help protect your friends, family, and others from possibly getting COVID-19 from you. Stay home and away from others: If possible, stay away from others, especially people who are at higher risk for getting very sick from COVID-19, such as older adults and people with other medical conditions. If you have been in contact with someone with COVID-19, stay home and away from others for 14 days after your last contact with that person. Follow the recommendations of your local public health department if you need to quarantine. If you have a fever, cough or other symptoms of COVID-19, stay home and away from others (except to get medical care). Monitor your health: Watch for fever, cough, shortness of breath, or other symptoms of COVID-19. Remember, symptoms may appear 2- 14 days after exposure to COVID-19 and can include: Fever or chills Cough Shortness of breath or difficulty breathing Tiredness Muscle or body aches Headache New loss of taste or smell Sore throat Congestion or runny nose Nausea or vomiting Diarrhea 2. Think about the people you have recently been around. If you are diagnosed with  ZGYFV-49, a public health worker may call you to check on your health, discuss who you have been around, and ask where you spent time while you may have been able to spread COVID-19 to others. While you wait for your COVID-19 test result, think about everyone you have been around recently. This will be important information to give health workers if your test is positive. Complete the information on the back of this page to help you remember everyone you have been around. 3. Answer the phone call from the health department. Traci Powell. Riccardi (MRN: [id number]) . Printed at 08/21/2020 1:53 PM Page 19 of 20 . . .. .... .... ... ... ... If a public health worker calls you, answer the call to help slow the spread of COVID-19 in your community. Discussions with health department staff are confidential. This means that your personal and medical information will be kept private and only shared with those who may need to know, like your health care provider. Your name will not be shared with those you came in contact with. The health department will only notify people you were in close contact with (within 6 feet for more than 15 minutes) that they might have been exposed to COVID-19. Think about the people you have recently been around If you test positive and are diagnosed with COVID-19, someone from the health department may call to check-in on your health, discuss who you have been around, and ask where you spent time while you may have been able to spread COVID-19 to others. This form can help you think about people you have recently been around so you will be ready if a public health worker calls you. Things to think about. Have you: Gone to work or school? Gotten together with others (eaten out at [employer], gone out for drinks, exercised with others or gone to a gym, had friends or family over to your house, volunteered, gone to a party, pool, or park)? Gone to a store in person (e.g.,  grocery store, mall)? Gone to in-person appointments (e.g., salon, barber, doctor's or dentist's office)? Ridden in  a car with others (e.g., rideshare) or taken public transportation? Been inside a church, synagogue, mosque or other places of worship? Who lives with you? ______________________________________________________________________ ______________________________________________________________________ ______________________________________________________________________ ______________________________________________________________________ Who have you been around (less than 6 feet for a total of 15 minutes or more) in the last 10 days? (You may have more people to list than the space provided. If so, write on the front of this sheet or a separate piece of paper.) Name ______________________________________________ Phone number ____________________________________ Date you last saw them _____________________________ Where you last saw them ________________________________________________ Name ______________________________________________ Phone number ____________________________________ Date you last saw them _____________________________ Where you last saw them ________________________________________________ Name ______________________________________________ Phone number ____________________________________ Date you last saw them _____________________________ Where you last saw them ________________________________________________ Traci Powell. Standley (MRN: [id number]) . Printed at 08/21/2020 1:53 PM Page 20 of 50 ... ... .. .. .. .. .. Name ______________________________________________ Phone number ____________________________________ Date you last saw them _____________________________ Where you last saw them ________________________________________________ Name ______________________________________________ Phone number ____________________________________ Date you  last saw them _____________________________ Where you last saw them ________________________________________________ What have you done in the last 10 days with other people? Activity _____________________________________________ Location _________________________________________ Date ____________________________________________ Activity _____________________________________________ Location _________________________________________ Date ____________________________________________ Activity _____________________________________________ Location _________________________________________ Date ____________________________________________ Activity _____________________________________________ Location _________________________________________ Date ____________________________________________ Activity _____________________________________________ Location _________________________________________ Date ____________________________________________ [facility] for Disease Control and Prevention [email or web address][facility]com 11/23/2019 This information is not intended to replace advice given to you by your health care provider. Make sure you discuss any questions you have with your health care provider. Document Revised: 03/17/2020 Document Reviewed: 03/17/2020 Elsevier Patient Education  2021 [employer].

## 2020-08-21 NOTE — NC FL2 (Signed)
[place] LEVEL OF CARE SCREENING TOOL     IDENTIFICATION  Patient Name: Traci Powell Birthdate: 11/29/1940 Sex: female Admission Date (Current Location): 08/19/2020  [facility] and [place] Number:  Herbalist and Address:  [facility],  [place] [address], [place]      Provider Number: [id number]  Attending Physician Name and Address:  Default, Provider, MD  Relative Name and Phone Number:  Brecklyn Galvis [phone]    Current Level of Care: Hospital Recommended Level of Care: [place] Prior Approval Number:    Date Approved/Denied:   PASRR Number:    Discharge Plan: SNF    Current Diagnoses: There are no problems to display for this patient.   Orientation RESPIRATION BLADDER Height & Weight     Self,Place  Normal Continent Weight:   Height:     BEHAVIORAL SYMPTOMS/MOOD NEUROLOGICAL BOWEL NUTRITION STATUS      Continent Diet (Regular)  AMBULATORY STATUS COMMUNICATION OF NEEDS Skin   Limited Assist Verbally Normal                       Personal Care Assistance Level of Assistance  Bathing,Feeding,Dressing Bathing Assistance: Limited assistance Feeding assistance: Independent Dressing Assistance: Limited assistance     Functional Limitations Info  Sight,Hearing,Speech Sight Info: Adequate Hearing Info: Adequate Speech Info: Adequate    SPECIAL CARE FACTORS FREQUENCY  PT (By licensed PT),OT (By licensed OT)     PT Frequency: 5x a week OT Frequency: 5x a week            Contractures Contractures Info: Not present    Additional Factors Info  Code Status,Allergies Code Status Info: Full Code Allergies Info: NKA           Current Medications (08/21/2020):  This is the current hospital active medication list Current Facility-Administered Medications  Medication Dose Route Frequency Provider Last Rate Last Admin  . divalproex (DEPAKOTE ER) 24 hr tablet 500 mg  500 mg Oral Daily  Isla Pence, MD   500 mg at 08/20/20 0949  . donepezil (ARICEPT) tablet 5 mg  5 mg Oral QHS Isla Pence, MD   5 mg at 08/20/20 2226  . hydrochlorothiazide (HYDRODIURIL) tablet 25 mg  25 mg Oral Daily Isla Pence, MD   25 mg at 08/20/20 0949  . lisinopril (ZESTRIL) tablet 20 mg  20 mg Oral Daily Isla Pence, MD   20 mg at 08/20/20 0949  . LORazepam (ATIVAN) tablet 1 mg  1 mg Oral Daily Isla Pence, MD   1 mg at 08/20/20 0950  . OLANZapine (ZYPREXA) tablet 5 mg  5 mg Oral QHS Isla Pence, MD   5 mg at 08/20/20 2226  . sodium chloride 0.9 % bolus 1,000 mL  1,000 mL Intravenous Once Isla Pence, MD       Current Outpatient Medications  Medication Sig Dispense Refill  . divalproex (DEPAKOTE ER) 500 MG 24 hr tablet Take 500 mg by mouth daily.    Marland Kitchen donepezil (ARICEPT) 5 MG tablet Take 5 mg by mouth at bedtime.    Marland Kitchen lisinopril-hydrochlorothiazide (ZESTORETIC) 20-25 MG tablet Take 1 tablet by mouth daily.    Marland Kitchen LORazepam (ATIVAN) 1 MG tablet Take 1 mg by mouth daily.    Marland Kitchen OLANZapine (ZYPREXA) 5 MG tablet Take 5 mg by mouth at bedtime.    . Vitamin D, Ergocalciferol, (DRISDOL) 1.25 MG (50000 UNIT) CAPS capsule Take 50,000 Units by mouth every  7 (seven) days.       Discharge Medications: Please see discharge summary for a list of discharge medications.  Relevant Imaging Results:  Relevant Lab Results:   Additional Information SSN#:  [id number]  Lillyen Schow C Tarpley-Carter, LCSWA

## 2020-08-21 NOTE — ED Provider Notes (Signed)
TOC/SW team indicate patient accepted to [facility], ready for discharge.  Pt alert, content, vitals stable. No new c/o.   Pt currently appears stable for d/c.      Lajean Saver, MD 08/21/20 1350

## 2020-08-21 NOTE — Progress Notes (Signed)
Status: Final result   Visible to patient: No (inaccessible in MyChart)   Next appt: None  Specimen Information: Nasopharyngeal Swab      0 Result Notes   Ref Range & Units 1 d ago  SARS Coronavirus 2 NEGATIVE POSITIVEAbnormal   Comment: (NOTE)  SARS-CoV-2 target nucleic acids are DETECTED.   The SARS-CoV-2 RNA is generally detectable in upper and lower  respiratory specimens during the acute phase of infection. Positive  results are indicative of the presence of SARS-CoV-2 RNA. Clinical  correlation with patient history and other diagnostic information is  necessary to determine patient infection status. Positive results do  not rule out bacterial infection or co-infection with other viruses.  The expected result is Negative.   Fact Sheet for Patients:  SugarRoll.be   Fact Sheet for Healthcare Providers:  https://www.woods-mathews.com/   This test is not yet approved or cleared by the [place] FDA and  has been authorized for detection and/or diagnosis of SARS-CoV-2 by  FDA under an Emergency Use Authorization (EUA). This EUA will remain  in effect (meaning this test can be used) for the duration of the  COVID-19 declaration under Section 564(b)(1) of the Act, 21 U.S.C.  section 360bbb-3(b)(1), unless the authorization is terminated or  revoked sooner.    Performed at [facility], [place] [address], [place], [place]  [address]   Resulting Agency  [facility]         Specimen Collected: 08/20/20 18:24 Last Resulted: 08/21/20 03:09     Lab Flowsheet   Order Details   View Encounter   Lab and Collection Details   Routing

## 2020-08-21 NOTE — Discharge Instructions (Signed)
It was our pleasure to provide your ER care today - we hope that you feel better.  Transport patient to [facility].   Fall precautions.  Follow up with primary care doctor in 1-2 weeks.   Return to ER if worse, new symptoms, fevers, trouble breathing, new/severe pain, or other emergency concern.

## 2020-08-21 NOTE — Progress Notes (Addendum)
TOC CM received auth from [place], [place] # [id number], ref [id number], starts 4/7-4/03/2021 next review date is 08/25/2020, CM, Luz Brazen, fax # [phone]. Information sent to [place], Lorenza Chick. [place], [place] ED TOC CM [phone]

## 2020-08-21 NOTE — ED Notes (Signed)
Patient's daughter called notifying her that mother has been transported to [facility]

## 2020-08-21 NOTE — Progress Notes (Signed)
.  Transition of Care [facility]) - Emergency Department Mini Assessment   Patient Details  Name: Traci Powell MRN: [id number] Date of Birth: 1940/12/26  [facility] CM/SW Contact:    Erenest Rasher, RN Phone Number: [phone] 08/21/2020, 2:45 PM   Clinical Narrative: TOC CM spoke to Traci Powell and states Traci Powell had [place], provided contact # [phone]. Gave permission to fax Medicare Recovery letter. Per Traci Powell, Admission Coordinator, Traci Powell pt has a claim making Medicare Secondary Payor. States she has faxed her HCPOA to Medicare and plans to call today. States she does not have a lawsuit or other insurance. Faxed Medicare Recovery letter to [facility].    ED Mini Assessment: What brought you to the Emergency Department? : fall, COVID  Barriers to Discharge: No Barriers Identified,Barriers Resolved  Barrier interventions: SNF placement  Means of departure: Ambulance  Interventions which prevented an admission or readmission: SNF Placement    Patient Contact and Communications Key Contact 1: Traci Powell Key Contact 2: Traci Powell Spoke with: Traci Powell Contact Date: 08/21/20,   Contact time: 1443 Contact Phone Number: [phone] Call outcome: requesting rehab  Patient states their goals for this hospitalization and ongoing recovery are:: SNF placment, gave permission to create FL2 and fax referral to SNF CMS Medicare.gov Compare Post Acute Care list provided to:: Patient Represenative (must comment) Traci Layman Traci Powell Powell) Choice offered to / list presented to : Adult Children  Admission diagnosis:  Fall There are no problems to display for this patient.  PCP:  Pcp, No Pharmacy:   [id number] - [place], [address] [place] [address] Phone: [phone] Fax: [phone]

## 2020-08-21 NOTE — NC FL2 (Addendum)
[place] LEVEL OF CARE SCREENING TOOL     IDENTIFICATION  Patient Name: Traci Powell Birthdate: 1940/07/01 Sex: female Admission Date (Current Location): 08/19/2020  [facility] and [place] Number:  Herbalist and Address:  [facility],  [place] [address] [place]      Provider Number: [id number]  Attending Physician Name and Address:  Default, Provider, MD  Relative Name and Phone Number:  Shermika Balthaser [phone]    Current Level of Care: Hospital Recommended Level of Care: [place] Prior Approval Number:    Date Approved/Denied:   PASRR Number:  [id number]  Discharge Plan: SNF    Current Diagnoses: There are no problems to display for this patient.   Orientation RESPIRATION BLADDER Height & Weight     Self,Place  Normal Continent Weight:   Height:     BEHAVIORAL SYMPTOMS/MOOD NEUROLOGICAL BOWEL NUTRITION STATUS      Continent Diet (Regular)  AMBULATORY STATUS COMMUNICATION OF NEEDS Skin   Limited Assist Verbally Normal                       Personal Care Assistance Level of Assistance  Bathing,Feeding,Dressing Bathing Assistance: Limited assistance Feeding assistance: Independent Dressing Assistance: Limited assistance     Functional Limitations Info  Sight,Hearing,Speech Sight Info: Adequate Hearing Info: Adequate Speech Info: Adequate    SPECIAL CARE FACTORS FREQUENCY  PT (By licensed PT),OT (By licensed OT)     PT Frequency: 5x a week OT Frequency: 5x a week            Contractures Contractures Info: Not present    Additional Factors Info  Code Status,Allergies Code Status Info: Full Code Allergies Info: NKA           Current Medications (08/21/2020):  This is the current hospital active medication list Current Facility-Administered Medications  Medication Dose Route Frequency Provider Last Rate Last Admin  . divalproex (DEPAKOTE ER) 24 hr tablet 500 mg  500 mg  Oral Daily Isla Pence, MD   500 mg at 08/21/20 1019  . donepezil (ARICEPT) tablet 5 mg  5 mg Oral QHS Isla Pence, MD   5 mg at 08/20/20 2226  . hydrochlorothiazide (HYDRODIURIL) tablet 25 mg  25 mg Oral Daily Isla Pence, MD   25 mg at 08/20/20 0949  . lisinopril (ZESTRIL) tablet 20 mg  20 mg Oral Daily Isla Pence, MD   20 mg at 08/21/20 1019  . LORazepam (ATIVAN) tablet 1 mg  1 mg Oral Daily Isla Pence, MD   1 mg at 08/21/20 1019  . OLANZapine (ZYPREXA) tablet 5 mg  5 mg Oral QHS Isla Pence, MD   5 mg at 08/20/20 2226  . sodium chloride 0.9 % bolus 1,000 mL  1,000 mL Intravenous Once Isla Pence, MD       Current Outpatient Medications  Medication Sig Dispense Refill  . divalproex (DEPAKOTE ER) 500 MG 24 hr tablet Take 500 mg by mouth daily.    Marland Kitchen donepezil (ARICEPT) 5 MG tablet Take 5 mg by mouth at bedtime.    Marland Kitchen lisinopril-hydrochlorothiazide (ZESTORETIC) 20-25 MG tablet Take 1 tablet by mouth daily.    Marland Kitchen LORazepam (ATIVAN) 1 MG tablet Take 1 mg by mouth daily.    Marland Kitchen OLANZapine (ZYPREXA) 5 MG tablet Take 5 mg by mouth at bedtime.    . Vitamin D, Ergocalciferol, (DRISDOL) 1.25 MG (50000 UNIT) CAPS capsule Take 50,000 Units by mouth every  7 (seven) days.       Discharge Medications: Please see discharge summary for a list of discharge medications.  Relevant Imaging Results:  Relevant Lab Results:   Additional Information SSN#:  [id number]  Makhia Vosler C Tarpley-Carter, LCSWA

## 2020-08-21 NOTE — Progress Notes (Signed)
TOC CSW attempted to contact [facility] [phone].  CSW left HIPPA compliant message with my contact information.  CSW will continue to follow for dc needs.  Kalix Meinecke Tarpley-Carter, MSW, LCSW-A Pronouns:  She, Her, [place] ED Transitions of CareClinical Social Worker [email or web address] [phone]

## 2020-08-21 NOTE — ED Notes (Signed)
Pt found out of bed twice despite bed alarm. Posey belt placed. Pt found out of bed again. Brief changed, posey belt reapplied, pt redirected to bed.

## 2020-08-21 NOTE — ED Notes (Signed)
Per Edwin Cap RN, called [facility] to set up transport to [facility]

## 2020-08-21 NOTE — ED Notes (Signed)
Provided daughter Ivin Booty with brief update. She requested call when [facility] arrives.

## 2020-08-22 ENCOUNTER — Telehealth: Payer: Self-pay

## 2020-08-22 NOTE — Telephone Encounter (Signed)
Called to discuss with patient about COVID-19 symptoms and the use of one of the available treatments for those with mild to moderate Covid symptoms and at a high risk of hospitalization.  Pt appears to qualify for outpatient treatment due to co-morbid conditions and/or a member of an at-risk group in accordance with the FDA Emergency Use Authorization.    Symptom onset: 08/19/20 Sinus symptoms Vaccinated: Yes Booster? No Immunocompromised? No Qualifiers:   Daughter declines any further treatment. States she is in nursing home.   Traci Powell

## 2021-06-14 ENCOUNTER — Emergency Department (HOSPITAL_COMMUNITY): Payer: Medicare PPO

## 2021-06-14 ENCOUNTER — Emergency Department (HOSPITAL_COMMUNITY)
Admission: EM | Admit: 2021-06-14 | Discharge: 2021-06-14 | Disposition: A | Discharge status: Home / Self Care | Payer: Medicare PPO | Attending: Emergency Medicine | Admitting: Emergency Medicine

## 2021-06-14 DIAGNOSIS — R7309 Other abnormal glucose: Secondary | ICD-10-CM | POA: Diagnosis not present

## 2021-06-14 DIAGNOSIS — F0390 Unspecified dementia without behavioral disturbance: Secondary | ICD-10-CM | POA: Diagnosis not present

## 2021-06-14 DIAGNOSIS — M25562 Pain in left knee: Secondary | ICD-10-CM | POA: Diagnosis not present

## 2021-06-14 DIAGNOSIS — S0083XA Contusion of other part of head, initial encounter: Secondary | ICD-10-CM | POA: Diagnosis not present

## 2021-06-14 DIAGNOSIS — S0990XA Unspecified injury of head, initial encounter: Secondary | ICD-10-CM

## 2021-06-14 DIAGNOSIS — W19XXXA Unspecified fall, initial encounter: Secondary | ICD-10-CM | POA: Diagnosis not present

## 2021-06-14 LAB — CBG MONITORING, ED: Glucose-Capillary: 152 mg/dL — ABNORMAL HIGH (ref 70–99)

## 2021-06-14 NOTE — ED Notes (Signed)
[facility] called for transport.  

## 2021-06-14 NOTE — ED Provider Notes (Signed)
[facility] EMERGENCY DEPARTMENT Provider Note   CSN: [id number] Arrival date & time: 06/14/21  1442     History  Chief Complaint  Patient presents with   Head Injury    hematoma    Traci Powell is a 81 y.o. female.  Level 5 caveat secondary to dementia.  Patient brought in from her facility for evaluation of a bruise over her left eye.  No known falls.  Patient thinks she might of fell but cannot remember when.  She denies any complaints.  She does not know what day it is.  The history is provided by the patient and the EMS personnel.  Head Injury Location:  Frontal Mechanism of injury: fall   Pain details:    Quality:  Unable to specify   Severity:  No pain Relieved by:  None tried Worsened by:  Nothing Ineffective treatments:  None tried Associated symptoms: no difficulty breathing, no headaches, no nausea, no neck pain and no vomiting   Risk factors: being elderly       Home Medications Prior to Admission medications   Medication Sig Start Date End Date Taking? Authorizing Provider  divalproex (DEPAKOTE ER) 500 MG 24 hr tablet Take 500 mg by mouth daily.    [provider]  donepezil (ARICEPT) 5 MG tablet Take 5 mg by mouth at bedtime.    [provider]  lisinopril-hydrochlorothiazide (ZESTORETIC) 20-25 MG tablet Take 1 tablet by mouth daily. 01/07/16   [provider]  LORazepam (ATIVAN) 1 MG tablet Take 1 mg by mouth daily.    [provider]  OLANZapine (ZYPREXA) 5 MG tablet Take 5 mg by mouth at bedtime.    [provider]  Vitamin D, Ergocalciferol, (DRISDOL) 1.25 MG (50000 UNIT) CAPS capsule Take 50,000 Units by mouth every 7 (seven) days.    [provider]      Allergies    Patient has no known allergies.    Review of Systems   Review of Systems  Constitutional:  Negative for fever.  HENT:  Negative for sore throat.   Eyes:  Negative for visual disturbance.  Respiratory:   Negative for shortness of breath.   Cardiovascular:  Negative for chest pain.  Gastrointestinal:  Negative for abdominal pain, nausea and vomiting.  Genitourinary:  Negative for dysuria.  Musculoskeletal:  Negative for neck pain.  Skin:  Negative for rash.  Neurological:  Negative for headaches.   Physical Exam Updated Vital Signs BP (!) 148/67    Pulse 67    Temp 98.9 F (37.2 C) (Oral)    Resp 16    Ht 5\' 5"  (1.651 m)    Wt 71.7 kg Comment: per record   SpO2 100%    BMI 26.29 kg/m  Physical Exam Vitals and nursing note reviewed.  Constitutional:      General: She is not in acute distress.    Appearance: Normal appearance. She is well-developed.  HENT:     Head: Normocephalic.     Comments: She has a 3 cm hematoma above her left eye that is mildly tender to palpation Eyes:     Conjunctiva/sclera: Conjunctivae normal.  Neck:     Comments: In cervical collar trach midline. Cardiovascular:     Rate and Rhythm: Normal rate and regular rhythm.     Heart sounds: No murmur heard. Pulmonary:     Effort: Pulmonary effort is normal. No respiratory distress.     Breath sounds: Normal breath sounds.  Abdominal:     Palpations: Abdomen is soft.     Tenderness: There is no abdominal tenderness.  Musculoskeletal:        General: Tenderness present. No swelling. Normal range of motion.     Comments: She is some tenderness diffusely around her left knee although no bruising or wounds are appreciated.  No effusion.  Full range of motion.  Skin:    General: Skin is warm and dry.     Capillary Refill: Capillary refill takes less than 2 seconds.  Neurological:     General: No focal deficit present.     Mental Status: She is alert. She is disoriented.    ED Results / Procedures / Treatments   Labs (all labs ordered are listed, but only abnormal results are displayed) Labs Reviewed  CBG MONITORING, ED - Abnormal; Notable for the following components:      Result Value   Glucose-Capillary  152 (*)    All other components within normal limits    EKG EKG Interpretation  Date/Time:  Sunday June 14 2021 15:13:38 EST Ventricular Rate:  68 PR Interval:    QRS Duration: 84 QT Interval:  402 QTC Calculation: 427 R Axis:   57 Text Interpretation: Normal sinus rhythm Septal infarct , age undetermined Abnormal ECG No previous ECGs available Confirmed by Aletta Edouard [phone]) on 06/14/2021 3:17:55 PM  Radiology CT Head Wo Contrast  Result Date: 06/14/2021 CLINICAL DATA:  Head trauma, minor (Age >= 65y); Neck trauma (Age >= 65y) EXAM: CT HEAD WITHOUT CONTRAST CT CERVICAL SPINE WITHOUT CONTRAST TECHNIQUE: Multidetector CT imaging of the head and cervical spine was performed following the standard protocol without intravenous contrast. Multiplanar CT image reconstructions of the cervical spine were also generated. RADIATION DOSE REDUCTION: This exam was performed according to the departmental dose-optimization program which includes automated exposure control, adjustment of the mA and/or kV according to patient size and/or use of iterative reconstruction technique. COMPARISON:  None. FINDINGS: CT HEAD FINDINGS BRAIN: BRAIN Cerebral ventricle sizes are concordant with the degree of cerebral volume loss. Patchy and confluent areas of decreased attenuation are noted throughout the deep and periventricular white matter of the cerebral hemispheres bilaterally, compatible with chronic microvascular ischemic disease. No evidence of large-territorial acute infarction. No parenchymal hemorrhage. No mass lesion. No extra-axial collection. No mass effect or midline shift. No hydrocephalus. Basilar cisterns are patent. Vascular: No hyperdense vessel. Skull: Markedly limited evaluation for acute fracture or focal lesion due to motion artifact. Sinuses/Orbits: Paranasal sinuses and mastoid air cells are clear. The orbits are not well visualized due to motion artifact. Other: Left frontal bone subcutaneus  soft tissue edema with no large hematoma formation. CT CERVICAL SPINE FINDINGS Alignment: Normal. Skull base and vertebrae: Multilevel severe degenerative changes spine. Associated multilevel severe osseous neural foraminal stenosis. No severe osseous central canal stenosis. C4 through C6 vertebral body fusion. No acute fracture. No aggressive appearing focal osseous lesion or focal pathologic process. Soft tissues and spinal canal: No prevertebral fluid or swelling. No visible canal hematoma. Upper chest: Unremarkable. Other: Partially visualized 0.9 cm vague hypodense right thyroid gland nodule. Not clinically significant; no follow-up imaging recommended (ref: J Am Coll Radiol. 2015 Feb;12(2): 143-50). IMPRESSION: 1. No acute intracranial abnormality with limited evaluation due to motion artifact. 2. No acute displaced fracture or traumatic listhesis of the cervical spine. 3. Multilevel severe degenerative changes spine. Associated multilevel severe osseous neural foraminal stenosis. Electronically Signed   By: Iven Finn M.D.   On: 06/14/2021  16:54   CT Cervical Spine Wo Contrast  Result Date: 06/14/2021 CLINICAL DATA:  Head trauma, minor (Age >= 65y); Neck trauma (Age >= 65y) EXAM: CT HEAD WITHOUT CONTRAST CT CERVICAL SPINE WITHOUT CONTRAST TECHNIQUE: Multidetector CT imaging of the head and cervical spine was performed following the standard protocol without intravenous contrast. Multiplanar CT image reconstructions of the cervical spine were also generated. RADIATION DOSE REDUCTION: This exam was performed according to the departmental dose-optimization program which includes automated exposure control, adjustment of the mA and/or kV according to patient size and/or use of iterative reconstruction technique. COMPARISON:  None. FINDINGS: CT HEAD FINDINGS BRAIN: BRAIN Cerebral ventricle sizes are concordant with the degree of cerebral volume loss. Patchy and confluent areas of decreased attenuation are  noted throughout the deep and periventricular white matter of the cerebral hemispheres bilaterally, compatible with chronic microvascular ischemic disease. No evidence of large-territorial acute infarction. No parenchymal hemorrhage. No mass lesion. No extra-axial collection. No mass effect or midline shift. No hydrocephalus. Basilar cisterns are patent. Vascular: No hyperdense vessel. Skull: Markedly limited evaluation for acute fracture or focal lesion due to motion artifact. Sinuses/Orbits: Paranasal sinuses and mastoid air cells are clear. The orbits are not well visualized due to motion artifact. Other: Left frontal bone subcutaneus soft tissue edema with no large hematoma formation. CT CERVICAL SPINE FINDINGS Alignment: Normal. Skull base and vertebrae: Multilevel severe degenerative changes spine. Associated multilevel severe osseous neural foraminal stenosis. No severe osseous central canal stenosis. C4 through C6 vertebral body fusion. No acute fracture. No aggressive appearing focal osseous lesion or focal pathologic process. Soft tissues and spinal canal: No prevertebral fluid or swelling. No visible canal hematoma. Upper chest: Unremarkable. Other: Partially visualized 0.9 cm vague hypodense right thyroid gland nodule. Not clinically significant; no follow-up imaging recommended (ref: J Am Coll Radiol. 2015 Feb;12(2): 143-50). IMPRESSION: 1. No acute intracranial abnormality with limited evaluation due to motion artifact. 2. No acute displaced fracture or traumatic listhesis of the cervical spine. 3. Multilevel severe degenerative changes spine. Associated multilevel severe osseous neural foraminal stenosis. Electronically Signed   By: Iven Finn M.D.   On: 06/14/2021 16:54   DG Knee Complete 4 Views Left  Result Date: 06/14/2021 CLINICAL DATA:  Left knee pain after fall EXAM: LEFT KNEE - COMPLETE 4+ VIEW COMPARISON:  08/19/2020 FINDINGS: No acute fracture or dislocation. 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 Trace joint effusion, nonspecific. Soft tissues within normal limits. 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 No acute findings. Electronically Signed   By: Davina Poke D.O.   On: 06/14/2021 15:55    Procedures Procedures    Medications Ordered in ED Medications - No data to display  ED Course/ Medical Decision Making/ A&P Clinical Course as of 06/14/21 2123  Nancy Fetter Jun 14, 2021  1631 X-ray left knee shows degenerative changes no acute fracture.  CT head and C-spine do not show any acute findings other than soft tissue contusion.  Awaiting radiology reading. [MB]    Clinical Course User Index [MB] Hayden Rasmussen, MD                           Medical Decision Making Amount and/or Complexity of Data Reviewed Radiology: ordered.  This patient complains of nothing, staff concerned about new bruise over patient's forehead; this involves an extensive number of treatment Options and is a complaint that carries with it a high risk of complications and Morbidity.  The differential includes fall, head injury, intracranial bleed, fracture, dislocation, hypoglycemia  I ordered, reviewed and interpreted labs, which included fingerstick with slightly elevated blood sugars I ordered imaging studies which included CT head and C-spine, x-rays of left knee and I independently    visualized and interpreted imaging which showed no acute fracture or bleed.  Does have degenerative changes in the knee and spine Additional history obtained from EMS, no clear report of fall Previous records obtained and reviewed including prior to ED visit last year for fall and dementia  After the interventions stated above, I reevaluated the patient and found patient ambulatory in department without any difficulty.  She was directed multiple times this to try to sit down for safety no indications for admission at this time.  Will return to  her facility by ambulance.  No family present at this time.          Final Clinical Impression(s) / ED Diagnoses Final diagnoses:  Injury of head, initial encounter    Rx / DC Orders ED Discharge Orders     None         Hayden Rasmussen, MD 06/14/21 2126

## 2021-06-14 NOTE — ED Notes (Signed)
Received verbal report from [place] at this time

## 2021-06-14 NOTE — ED Notes (Signed)
NT assisted pt to BR. Pt stable on feet.

## 2021-06-14 NOTE — ED Triage Notes (Addendum)
Pt to ED via EMS from [facility] . Pt usually ambulatory. Hematoma noted above left eyebrow by nursing staff aprox 30 mins before EMS arrival. Nursing staff unsure when or how pt obtained hematoma, assume pt fell at some point. .   Pt orientation at baseline, pt has hx of dementia. Not on blood thinners. Last VS: 154/82. 74P 18RR, CBG 176. M[facility] accompanies pt. Pt has no complaints and no memory of how she obtained hematoma.

## 2021-06-14 NOTE — ED Notes (Signed)
[facility] arrived for transport at this time 

## 2021-06-14 NOTE — ED Notes (Signed)
Pt transported to xray 

## 2021-06-14 NOTE — Discharge Instructions (Signed)
You were seen in the emergency department for evaluation of a bruise over your left eye.  You had a CAT scan of your head and neck along with x-rays of your left knee that did not show any acute findings.  Please follow-up with your primary care doctor and return to the emergency department if any worsening or concerning symptoms.

## 2021-06-14 NOTE — ED Notes (Signed)
Patient kept asking "where am I?" Pt attempted to get out of bed to go to restroom. This tech assisted patient to restroom and showed patient back to her bed.

## 2022-04-13 ENCOUNTER — Other Ambulatory Visit: Payer: Self-pay | Admitting: Urology

## 2022-04-13 NOTE — Progress Notes (Addendum)
For Short Stay: [place] appointment date: N/A  Bowel Prep reminder: N/A   For Anesthesia: PCP - Dr. Jules Husbands Cardiologist -  N/A  Chest x-ray - greater than 1 year in [facility] EKG - 06/14/21 in [facility] Stress Test - N/A ECHO - N/A Cardiac Cath - N/A Pacemaker/ICD device last checked:N/A Pacemaker orders received:N/A Device Rep notified:N/A  Spinal Cord Stimulator: N/A  Sleep Study - N/A CPAP - N/A  Fasting Blood Sugar - N/A Checks Blood Sugar __N/A___ times a day Date and result of last Hgb A1c- N/A  Last dose of GLP1 agonist-  N/A GLP1 instructions:  N/A  Last dose of SGLT-2 inhibitors- N/A SGLT-2 instructions:N/A  Blood Thinner Instructions: N/A Aspirin Instructions: Last Dose:  Activity level:  activities of daily living without stopping and without chest pain and/or shortness of breath    Anesthesia review: N/A  Patient denies shortness of breath, fever, cough and chest pain at PAT appointment   Patient verbalized understanding of instructions reviewed via telephone.

## 2022-04-16 ENCOUNTER — Encounter (HOSPITAL_COMMUNITY): Payer: Self-pay | Admitting: Urology

## 2022-04-16 NOTE — Patient Instructions (Addendum)
Preop instructions for:  Traci Powell    Date of Birth: [birth year]                    Date of Procedure:   Tuesday, Dec. 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 Irine Seal Facility contact:   [facility]  Phone: [place]:  Traci Powell and Traci Mccreedy RN contact name/phone#:  Traci Mallow RN                        and Fax #: [phone]   Transportation contact phone#: [facility] accompanied by daughter Traci Powell  Please send day of procedure:  Current med list  Medications taken the day of procedure (return attached form day of surgery) Confirm time of nothing by mouth status (return attached form day of surgery) Patient Demographic info( to include DNR status, problem list, allergies) Bring Insurance card and picture ID    Time to arrive at [facility]: 9:15 AM   Report to: Admitting (On your left hand side)    Do not eat solid food or drink liquids past midnight the night before your procedure.(To include any tube feedings-must be discontinued)   Take these morning medications only with sips of water.(or give through gastrostomy or feeding tube).  Acetaminophen Amlodipine Namenda Tramadol if can tolerate on an empty stomach   Note: No Insulin or Diabetic meds should be given or taken the morning of the procedure!  Oral Hygiene is also important to reduce your risk of infection.                                    Remember - BRUSH YOUR TEETH THE MORNING OF SURGERY WITH YOUR REGULAR TOOTHPASTE   DENTURES WILL BE REMOVED PRIOR TO SURGERY PLEASE DO NOT APPLY "Poly grip" OR ADHESIVES!!!   Leave all jewelry and other valuables at place where living( no metal or rings to be worn) No contact lens Women-no make-up, no lotions,perfumes,powders   Any questions day of procedure,call  [facility]AY-[phone]     Sent  from :[facility]                   [phone]                   Fax:[phone]   Sent by : Harlon Flor BSN, RN

## 2022-04-19 NOTE — H&P (Signed)
I have blood in my urine.     04/12/22: Traci Powell returns today for cystoscopy to complete her hematuria evaluation. The CT stone study showed no upper tract lesions but without contrast the bladder wasn't well evaluated. She continues to have intermittent hematuria.   03/30/22: Traci Powell is a 81 yo female who is sent for gross hematuria since September. She was treated for a UTI prior to that. She has had additional episodes and there were some clots. She has been treated for a couple of UTI's but the last episode was not felt to be from infection. She had some heaviness with voiding on one occasion but no flank pain. She has no prior history of stones. She has had some UTI's in the nursing facility where she has been for 2 years. She has had no GUi surgery. She has had a hysterectomy and a partial colectomy with colostomy and take down. She was on BASA but is off of that. She was a smoker in the past but quit about 15 years ago. She last saw blood in the urine today. Her last labs were in 4/22 that I can find and her Cr was up at 1.23 and her Ca was up at 10.7. A UA then had microhematuria. She had a KUB in 2019 that was negative.    ALLERGIES: Contrast Dye    MEDICATIONS: Lisinopril 20 mg tablet  Acetaminophen  Amlodipine Besylate 2.5 mg tablet  Biofreeze  Colace  Depakote  Melatonin  Melatonin  Namenda 10 mg tablet  Pravastatin Sodium 10 mg tablet  Tramadol Hcl  Vitamin D3  Voltaren Arthritis Pain 1 % gel     Notes: She has completed the Cipro and is off of the aspirin.    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 I will get a CMP and CBC. - 03/30/2022 Gross hematuria, She has gross hematuria that has been on going but intermittent and is improving. She had microhematuria in 4/22. The CT today showed no obvious renal or  ureteral issues and the bladder was decompressed without obvious lesions but the report is pending. I will have her return for cystoscopy to complete the evaluation. - 03/30/2022      PMH Notes: Stomach ulcers   NON-GU PMH: Hypercalcemia, her calcium was up at her last labs as well and that will be rechecked. - 03/30/2022 Anxiety Arthritis Colon Cancer, History Hypertension    FAMILY HISTORY: Death In The Family Father - Other Death In The Family Mother - Other   SOCIAL HISTORY: Marital Status: Married Preferred Language: English; Ethnicity: Not Hispanic Or Latino; Race: Black or African American Current Smoking Status: Patient does not smoke anymore.   Tobacco Use Assessment Completed: Used Tobacco in last 30 days? Does not use smokeless tobacco. Has never drank.  Drinks 2 caffeinated drinks per day.    REVIEW OF SYSTEMS:    GU Review Female:   Patient denies frequent urination, hard to postpone urination, burning /pain with urination, get up at night to urinate, leakage of urine, stream starts and stops, trouble starting your stream, have to strain to urinate, and being pregnant.  Gastrointestinal (Upper):   Patient denies nausea, vomiting, and indigestion/ heartburn.  Gastrointestinal (Lower):   Patient denies diarrhea and constipation.  Constitutional:   Patient denies fever, night sweats,  weight loss, and fatigue.  Skin:   Patient denies skin rash/ lesion and itching.  Eyes:   Patient denies blurred vision and double vision.  Ears/ Nose/ Throat:   Patient denies sore throat and sinus problems.  Hematologic/Lymphatic:   Patient denies swollen glands and easy bruising.  Cardiovascular:   Patient denies leg swelling and chest pains.  Respiratory:   Patient denies cough and shortness of breath.  Endocrine:   Patient denies excessive thirst.  Musculoskeletal:   Patient denies back pain and joint pain.  Neurological:   Patient denies headaches and dizziness.  Psychologic:    Patient denies depression and anxiety.   Notes: Hematuria    VITAL SIGNS: None   MULTI-SYSTEM PHYSICAL EXAMINATION:    Constitutional: Well-nourished. No physical deformities. Normally developed. Good grooming.  Respiratory: No labored breathing, no use of accessory muscles.   Cardiovascular: Regular rate and rhythm. No murmur, no gallop. Normal temperature, normal extremity pulses, no swelling, no varicosities.      Complexity of Data:  Records Review:   Previous Patient Records  Urine Test Review:   Urinalysis   PROCEDURES:         Flexible Cystoscopy - 52000  Risks, benefits, and some of the potential complications of the procedure were discussed. She was prepped with betadine.   Meatus:  Normal size. Normal location. Normal condition.  Urethra:  No hypermobility. No leakage.  Ureteral Orifices:  Normal location. Normal size. Normal shape. Effluxed clear urine.  Bladder:  No trabeculation. There are two tumors, a 71m tumor above the right trigone with adherent clot and at 1.5cm tumor on the right lateral wall. . Normal mucosa. No stones.      The procedure was well tolerated and there were no complications.         Urinalysis w/Scope Dipstick Dipstick Cont'd Micro  Color: Red Bilirubin: Invalid mg/dL WBC/hpf: NS (Not Seen)  Appearance: Cloudy Ketones: Invalid mg/dL RBC/hpf: Packed/hpf  Specific Gravity: Invalid Blood: Invalid ery/uL Bacteria: Mod (26-50/hpf)  pH: Invalid Protein: Invalid mg/dL Cystals: NS (Not Seen)  Glucose: Invalid mg/dL Urobilinogen: Invalid mg/dL Casts: NS (Not Seen)    Nitrites: Invalid Trichomonas: Not Present    Leukocyte Esterase: Invalid leu/uL Mucous: Not Present      Epithelial Cells: 0 - 5/hpf      Yeast: NS (Not Seen)      Sperm: Not Present    Notes: Unconcentrated sample for microscopic exaM due to clarity    ASSESSMENT:      ICD-10 Details  1 GU:   Bladder tumor/neoplasm - D41.4 Undiagnosed New Problem - She has persistent hematuria  with 2 bladder neoplasms noted. She is going to need a TURBT with possible gemcitabine and I reviewed the risks of bleeding, infection, bladder wall injury, need for secondary procedures, chemical cystitis, thrombotic events and anesthetic complications.   2   Gross hematuria - R31.0 Chronic, Stable   PLAN:           Orders Labs Urine Cytology          Schedule Return Visit/Planned Activity: ASAP - Schedule Surgery          Document Letter(s):  Created for Patient: Clinical Summary

## 2022-04-19 NOTE — H&P (View-Only) (Signed)
I have blood in my urine.     04/12/22: Traci Powell returns today for cystoscopy to complete her hematuria evaluation. The CT stone study showed no upper tract lesions but without contrast the bladder wasn't well evaluated. She continues to have intermittent hematuria.   03/30/22: Traci Powell is a 81 yo female who is sent for gross hematuria since September. She was treated for a UTI prior to that. She has had additional episodes and there were some clots. She has been treated for a couple of UTI's but the last episode was not felt to be from infection. She had some heaviness with voiding on one occasion but no flank pain. She has no prior history of stones. She has had some UTI's in the nursing facility where she has been for 2 years. She has had no GUi surgery. She has had a hysterectomy and a partial colectomy with colostomy and take down. She was on BASA but is off of that. She was a smoker in the past but quit about 15 years ago. She last saw blood in the urine today. Her last labs were in 4/22 that I can find and her Cr was up at 1.23 and her Ca was up at 10.7. A UA then had microhematuria. She had a KUB in 2019 that was negative.    ALLERGIES: Contrast Dye    MEDICATIONS: Lisinopril 20 mg tablet  Acetaminophen  Amlodipine Besylate 2.5 mg tablet  Biofreeze  Colace  Depakote  Melatonin  Melatonin  Namenda 10 mg tablet  Pravastatin Sodium 10 mg tablet  Tramadol Hcl  Vitamin D3  Voltaren Arthritis Pain 1 % gel     Notes: She has completed the Cipro and is off of the aspirin.    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 I will get a CMP and CBC. - 03/30/2022 Gross hematuria, She has gross hematuria that has been on going but intermittent and is improving. She had microhematuria in 4/22. The CT today showed no obvious renal or  ureteral issues and the bladder was decompressed without obvious lesions but the report is pending. I will have her return for cystoscopy to complete the evaluation. - 03/30/2022      PMH Notes: Stomach ulcers   NON-GU PMH: Hypercalcemia, her calcium was up at her last labs as well and that will be rechecked. - 03/30/2022 Anxiety Arthritis Colon Cancer, History Hypertension    FAMILY HISTORY: Death In The Family Father - Other Death In The Family Mother - Other   SOCIAL HISTORY: Marital Status: Married Preferred Language: English; Ethnicity: Not Hispanic Or Latino; Race: Black or African American Current Smoking Status: Patient does not smoke anymore.   Tobacco Use Assessment Completed: Used Tobacco in last 30 days? Does not use smokeless tobacco. Has never drank.  Drinks 2 caffeinated drinks per day.    REVIEW OF SYSTEMS:    GU Review Female:   Patient denies frequent urination, hard to postpone urination, burning /pain with urination, get up at night to urinate, leakage of urine, stream starts and stops, trouble starting your stream, have to strain to urinate, and being pregnant.  Gastrointestinal (Upper):   Patient denies nausea, vomiting, and indigestion/ heartburn.  Gastrointestinal (Lower):   Patient denies diarrhea and constipation.  Constitutional:   Patient denies fever, night sweats,  weight loss, and fatigue.  Skin:   Patient denies skin rash/ lesion and itching.  Eyes:   Patient denies blurred vision and double vision.  Ears/ Nose/ Throat:   Patient denies sore throat and sinus problems.  Hematologic/Lymphatic:   Patient denies swollen glands and easy bruising.  Cardiovascular:   Patient denies leg swelling and chest pains.  Respiratory:   Patient denies cough and shortness of breath.  Endocrine:   Patient denies excessive thirst.  Musculoskeletal:   Patient denies back pain and joint pain.  Neurological:   Patient denies headaches and dizziness.  Psychologic:    Patient denies depression and anxiety.   Notes: Hematuria    VITAL SIGNS: None   MULTI-SYSTEM PHYSICAL EXAMINATION:    Constitutional: Well-nourished. No physical deformities. Normally developed. Good grooming.  Respiratory: No labored breathing, no use of accessory muscles.   Cardiovascular: Regular rate and rhythm. No murmur, no gallop. Normal temperature, normal extremity pulses, no swelling, no varicosities.      Complexity of Data:  Records Review:   Previous Patient Records  Urine Test Review:   Urinalysis   PROCEDURES:         Flexible Cystoscopy - 52000  Risks, benefits, and some of the potential complications of the procedure were discussed. She was prepped with betadine.   Meatus:  Normal size. Normal location. Normal condition.  Urethra:  No hypermobility. No leakage.  Ureteral Orifices:  Normal location. Normal size. Normal shape. Effluxed clear urine.  Bladder:  No trabeculation. There are two tumors, a 75m tumor above the right trigone with adherent clot and at 1.5cm tumor on the right lateral wall. . Normal mucosa. No stones.      The procedure was well tolerated and there were no complications.         Urinalysis w/Scope Dipstick Dipstick Cont'd Micro  Color: Red Bilirubin: Invalid mg/dL WBC/hpf: NS (Not Seen)  Appearance: Cloudy Ketones: Invalid mg/dL RBC/hpf: Packed/hpf  Specific Gravity: Invalid Blood: Invalid ery/uL Bacteria: Mod (26-50/hpf)  pH: Invalid Protein: Invalid mg/dL Cystals: NS (Not Seen)  Glucose: Invalid mg/dL Urobilinogen: Invalid mg/dL Casts: NS (Not Seen)    Nitrites: Invalid Trichomonas: Not Present    Leukocyte Esterase: Invalid leu/uL Mucous: Not Present      Epithelial Cells: 0 - 5/hpf      Yeast: NS (Not Seen)      Sperm: Not Present    Notes: Unconcentrated sample for microscopic exaM due to clarity    ASSESSMENT:      ICD-10 Details  1 GU:   Bladder tumor/neoplasm - D41.4 Undiagnosed New Problem - She has persistent hematuria  with 2 bladder neoplasms noted. She is going to need a TURBT with possible gemcitabine and I reviewed the risks of bleeding, infection, bladder wall injury, need for secondary procedures, chemical cystitis, thrombotic events and anesthetic complications.   2   Gross hematuria - R31.0 Chronic, Stable   PLAN:           Orders Labs Urine Cytology          Schedule Return Visit/Planned Activity: ASAP - Schedule Surgery          Document Letter(s):  Created for Patient: Clinical Summary

## 2022-04-20 ENCOUNTER — Ambulatory Visit (HOSPITAL_COMMUNITY)
Admission: RE | Admit: 2022-04-20 | Discharge: 2022-04-20 | Disposition: A | Discharge status: Skilled Nursing Facility | Payer: Medicare PPO | Attending: Urology | Admitting: Urology

## 2022-04-20 ENCOUNTER — Encounter (HOSPITAL_COMMUNITY): Payer: Self-pay | Admitting: Urology

## 2022-04-20 ENCOUNTER — Other Ambulatory Visit: Payer: Self-pay | Admitting: Urology

## 2022-04-20 ENCOUNTER — Encounter (HOSPITAL_COMMUNITY)
Admission: RE | Disposition: A | Discharge status: Skilled Nursing Facility | Payer: Self-pay | Source: Home / Self Care | Attending: Urology

## 2022-04-20 ENCOUNTER — Other Ambulatory Visit: Payer: Self-pay

## 2022-04-20 DIAGNOSIS — N183 Chronic kidney disease, stage 3 unspecified: Secondary | ICD-10-CM | POA: Diagnosis not present

## 2022-04-20 DIAGNOSIS — I129 Hypertensive chronic kidney disease with stage 1 through stage 4 chronic kidney disease, or unspecified chronic kidney disease: Secondary | ICD-10-CM | POA: Insufficient documentation

## 2022-04-20 DIAGNOSIS — Z539 Procedure and treatment not carried out, unspecified reason: Secondary | ICD-10-CM | POA: Insufficient documentation

## 2022-04-20 DIAGNOSIS — N289 Disorder of kidney and ureter, unspecified: Secondary | ICD-10-CM

## 2022-04-20 DIAGNOSIS — Z87891 Personal history of nicotine dependence: Secondary | ICD-10-CM | POA: Diagnosis not present

## 2022-04-20 DIAGNOSIS — R310 Gross hematuria: Secondary | ICD-10-CM | POA: Insufficient documentation

## 2022-04-20 HISTORY — DX: Spinal stenosis, cervical region: M48.02

## 2022-04-20 HISTORY — DX: Chronic kidney disease, unspecified: N18.9

## 2022-04-20 HISTORY — DX: Cerebral infarction, unspecified: I63.9

## 2022-04-20 HISTORY — DX: Insomnia, unspecified: G47.00

## 2022-04-20 HISTORY — DX: Unspecified abnormalities of gait and mobility: R26.9

## 2022-04-20 SURGERY — TRANSURETHRAL RESECTION OF BLADDER TUMOR WITH MITOMYCIN-C
Anesthesia: General

## 2022-04-20 MED ORDER — LACTATED RINGERS IV SOLN: INTRAVENOUS | Status: DC

## 2022-04-20 MED ORDER — ORAL CARE MOUTH RINSE: 15.0000 mL | Freq: Once | OROMUCOSAL | Status: DC

## 2022-04-20 MED ORDER — ACETAMINOPHEN 500 MG PO TABS: 1000.0000 mg | ORAL_TABLET | Freq: Once | ORAL | Status: DC

## 2022-04-20 MED ORDER — CEFAZOLIN SODIUM-DEXTROSE 2-4 GM/100ML-% IV SOLN: 2.0000 g | INTRAVENOUS | Status: DC

## 2022-04-20 MED ORDER — CHLORHEXIDINE GLUCONATE 0.12 % MT SOLN: 15.0000 mL | Freq: Once | OROMUCOSAL | Status: DC

## 2022-04-20 MED ORDER — GEMCITABINE CHEMO FOR BLADDER INSTILLATION 2000 MG: 2000.0000 mg | Freq: Once | INTRAVENOUS | Status: DC

## 2022-04-20 SURGICAL SUPPLY — 20 items
BAG URINE DRAIN 2000ML AR STRL (UROLOGICAL SUPPLIES) IMPLANT
BAG URO CATCHER STRL LF (MISCELLANEOUS) ×1 IMPLANT
CATH FOLEY 3WAY 30CC 22FR (CATHETERS) IMPLANT
CATH URETL OPEN 5X70 (CATHETERS) IMPLANT
DRAPE FOOT SWITCH (DRAPES) ×1 IMPLANT
ELECT REM PT RETURN 15FT ADLT (MISCELLANEOUS) ×1 IMPLANT
GLOVE SURG SS PI 8.0 STRL IVOR (GLOVE) ×1 IMPLANT
GOWN STRL REUS W/ TWL XL LVL3 (GOWN DISPOSABLE) ×1 IMPLANT
GOWN STRL REUS W/TWL XL LVL3 (GOWN DISPOSABLE) ×1
HOLDER FOLEY CATH W/STRAP (MISCELLANEOUS) IMPLANT
KIT TURNOVER KIT A (KITS) ×1 IMPLANT
LOOP CUT BIPOLAR 24F LRG (ELECTROSURGICAL) IMPLANT
MANIFOLD NEPTUNE II (INSTRUMENTS) ×1 IMPLANT
PACK CYSTO (CUSTOM PROCEDURE TRAY) ×1 IMPLANT
SUT ETHILON 3 0 PS 1 (SUTURE) IMPLANT
SYR 30ML LL (SYRINGE) IMPLANT
SYR TOOMEY IRRIG 70ML (MISCELLANEOUS)
SYRINGE TOOMEY IRRIG 70ML (MISCELLANEOUS) IMPLANT
TUBING CONNECTING 10 (TUBING) ×1 IMPLANT
TUBING UROLOGY SET (TUBING) ×1 IMPLANT

## 2022-04-20 NOTE — Anesthesia Preprocedure Evaluation (Signed)
Anesthesia Evaluation  Patient identified by MRN, date of birth, ID band Patient confused    Reviewed: Allergy & Precautions, H&P , NPO status , Patient's Chart, lab work & pertinent test results, Unable to perform ROS - Chart review only  Airway Mallampati: I  TM Distance: >3 FB Neck ROM: Full    Dental no notable dental hx. (+) Edentulous Upper, Dental Advisory Given   Pulmonary former smoker   Pulmonary exam normal breath sounds clear to auscultation       Cardiovascular negative cardio ROS  Rhythm:Regular Rate:Normal     Neuro/Psych       Dementia negative neurological ROS     GI/Hepatic negative GI ROS, Neg liver ROS,,,  Endo/Other  negative endocrine ROS    Renal/GU Renal InsufficiencyRenal disease  negative genitourinary   Musculoskeletal negative musculoskeletal ROS (+)    Abdominal   Peds  Hematology negative hematology ROS (+)   Anesthesia Other Findings   Reproductive/Obstetrics negative OB ROS                             Anesthesia Physical Anesthesia Plan  ASA: 3  Anesthesia Plan: General   Post-op Pain Management: Tylenol PO (pre-op)* and Minimal or no pain anticipated   Induction: Intravenous  PONV Risk Score and Plan: 3 and Ondansetron, Dexamethasone and Treatment may vary due to age or medical condition  Airway Management Planned: Oral ETT and Video Laryngoscope Planned  Additional Equipment:   Intra-op Plan:   Post-operative Plan: Extubation in OR  Informed Consent: I have reviewed the patients History and Physical, chart, labs and discussed the procedure including the risks, benefits and alternatives for the proposed anesthesia with the patient or authorized representative who has indicated his/her understanding and acceptance.     Dental advisory given  Plan Discussed with: CRNA  Anesthesia Plan Comments:         Anesthesia Quick  Evaluation

## 2022-04-20 NOTE — Progress Notes (Signed)
Upon patient's denture removal by daughter Brayton Layman and RN in the room food was discovered in dentures. Per [facility] mouth care and denture cleaning performed the night before (04/19/22). Daughter Brayton Layman called facility to discuss and per discussion it was verified that patient ate this AM (04/20/22).  Dr. Lissa Hoard with anesthesia notified. Dr. Jeffie Pollock notified. OR notified.  Decision to cancel surgery for today. Rescheduled for Thur. 04/22/22.  Patient family and facility aware. Aware that patient must be NPO post MN.  Ride arranged for patient transport back to facility.  Daughter Brayton Layman ([place]) stayed with mother until discharge.

## 2022-04-20 NOTE — Progress Notes (Signed)
Spoke with nurse Lucille Passy at [facility] she confirmed she received the pre op instructions that were faxed and she understood them at this time.

## 2022-04-20 NOTE — Patient Instructions (Addendum)
Preop instructions for:  Traci Powell    Date of Birth: 01-20-1941                    Date of Procedure:   Thursday, Dec. 7, 2023 Procedure:   TRANSURETHRAL RESECTION OF BLADDER TUMOR WITH POSSIBLE GEMCITABINE    Surgeon: Dr. Irine Seal Facility contact:   [facility]  Phone: [place]:  [name] and [name] RN contact name/phone#:  Lucas Mallow RN                        and Fax #: [phone]   Transportation contact phone#: [facility] accompanied by daughter Elly Haffey   Please send day of procedure:       Current med list  Medications taken the day of procedure (return attached form day of surgery) Confirm time of nothing by mouth status (return attached form day of surgery) Patient Demographic info( to include DNR status, problem list, allergies) Bring Insurance card and picture ID     Time to arrive at [facility]: 9:45 AM   Report to: Admitting (On your left hand side)    Do not eat solid food or drink liquids past midnight the night before your procedure.(To include any tube feedings-must be discontinued)   Take these morning medications only with sips of water.(or give through gastrostomy or feeding tube).  Acetaminophen Amlodipine Namenda Tramadol if can tolerate on an empty stomach   Note: No Insulin or Diabetic meds should be given or taken the morning of the procedure!   Oral Hygiene is also important to reduce your risk of infection.                                    Remember - BRUSH YOUR TEETH THE MORNING OF SURGERY WITH YOUR REGULAR TOOTHPASTE   DENTURES WILL BE REMOVED PRIOR TO SURGERY PLEASE DO NOT APPLY "Poly grip" OR ADHESIVES!!!   Leave all jewelry and other valuables at place where living( no metal or rings to be worn) No contact lens Women-no make-up, no lotions,perfumes,powders   Any questions day of procedure,call  [facility]-[phone]      Sent from :[facility]                   Phone:[phone]                   [phone]   Sent by : Harlon Flor BSN, RN

## 2022-04-22 ENCOUNTER — Other Ambulatory Visit: Payer: Self-pay

## 2022-04-22 ENCOUNTER — Ambulatory Visit (HOSPITAL_COMMUNITY)
Admission: RE | Admit: 2022-04-22 | Discharge: 2022-04-22 | Disposition: A | Discharge status: Skilled Nursing Facility | Payer: Medicare PPO | Attending: Urology | Admitting: Urology

## 2022-04-22 ENCOUNTER — Encounter (HOSPITAL_COMMUNITY)
Admission: RE | Disposition: A | Discharge status: Skilled Nursing Facility | Payer: Self-pay | Source: Home / Self Care | Attending: Urology

## 2022-04-22 ENCOUNTER — Ambulatory Visit (HOSPITAL_BASED_OUTPATIENT_CLINIC_OR_DEPARTMENT_OTHER): Payer: Medicare PPO | Admitting: Certified Registered Nurse Anesthetist

## 2022-04-22 ENCOUNTER — Encounter (HOSPITAL_COMMUNITY): Payer: Self-pay | Admitting: Urology

## 2022-04-22 ENCOUNTER — Ambulatory Visit (HOSPITAL_COMMUNITY): Payer: Medicare PPO | Admitting: Certified Registered Nurse Anesthetist

## 2022-04-22 DIAGNOSIS — Z87891 Personal history of nicotine dependence: Secondary | ICD-10-CM | POA: Insufficient documentation

## 2022-04-22 DIAGNOSIS — C672 Malignant neoplasm of lateral wall of bladder: Secondary | ICD-10-CM | POA: Diagnosis not present

## 2022-04-22 DIAGNOSIS — N289 Disorder of kidney and ureter, unspecified: Secondary | ICD-10-CM | POA: Diagnosis not present

## 2022-04-22 DIAGNOSIS — C670 Malignant neoplasm of trigone of bladder: Secondary | ICD-10-CM | POA: Insufficient documentation

## 2022-04-22 DIAGNOSIS — F0390 Unspecified dementia without behavioral disturbance: Secondary | ICD-10-CM | POA: Diagnosis not present

## 2022-04-22 DIAGNOSIS — D494 Neoplasm of unspecified behavior of bladder: Secondary | ICD-10-CM

## 2022-04-22 DIAGNOSIS — R310 Gross hematuria: Secondary | ICD-10-CM | POA: Insufficient documentation

## 2022-04-22 DIAGNOSIS — I10 Essential (primary) hypertension: Secondary | ICD-10-CM

## 2022-04-22 HISTORY — PX: TRANSURETHRAL RESECTION OF BLADDER TUMOR WITH MITOMYCIN-C: SHX6459

## 2022-04-22 LAB — CBC WITH DIFFERENTIAL/PLATELET
Abs Immature Granulocytes: 0.03 10*3/uL (ref 0.00–0.07)
Basophils Absolute: 0 10*3/uL (ref 0.0–0.1)
Basophils Relative: 1 %
Eosinophils Absolute: 0.1 10*3/uL (ref 0.0–0.5)
Eosinophils Relative: 2 %
HCT: 38.3 % (ref 36.0–46.0)
Hemoglobin: 12.8 g/dL (ref 12.0–15.0)
Immature Granulocytes: 1 %
Lymphocytes Relative: 35 %
Lymphs Abs: 2.3 10*3/uL (ref 0.7–4.0)
MCH: 28.9 pg (ref 26.0–34.0)
MCHC: 33.4 g/dL (ref 30.0–36.0)
MCV: 86.5 fL (ref 80.0–100.0)
Monocytes Absolute: 0.8 10*3/uL (ref 0.1–1.0)
Monocytes Relative: 12 %
Neutro Abs: 3.2 10*3/uL (ref 1.7–7.7)
Neutrophils Relative %: 49 %
Platelets: 344 10*3/uL (ref 150–400)
RBC: 4.43 MIL/uL (ref 3.87–5.11)
RDW: 17 % — ABNORMAL HIGH (ref 11.5–15.5)
WBC: 6.4 10*3/uL (ref 4.0–10.5)
nRBC: 0 % (ref 0.0–0.2)

## 2022-04-22 LAB — COMPREHENSIVE METABOLIC PANEL
ALT: 12 U/L (ref 0–44)
AST: 18 U/L (ref 15–41)
Albumin: 3.9 g/dL (ref 3.5–5.0)
Alkaline Phosphatase: 60 U/L (ref 38–126)
Anion gap: 8 (ref 5–15)
BUN: 16 mg/dL (ref 8–23)
CO2: 29 mmol/L (ref 22–32)
Calcium: 10.4 mg/dL — ABNORMAL HIGH (ref 8.9–10.3)
Chloride: 107 mmol/L (ref 98–111)
Creatinine, Ser: 0.88 mg/dL (ref 0.44–1.00)
GFR, Estimated: >60 mL/min (ref >60)
Glucose, Bld: 82 mg/dL (ref 70–99)
Potassium: 4 mmol/L (ref 3.5–5.1)
Sodium: 144 mmol/L (ref 135–145)
Total Bilirubin: 0.6 mg/dL (ref 0.3–1.2)
Total Protein: 7.8 g/dL (ref 6.5–8.1)

## 2022-04-22 SURGERY — TRANSURETHRAL RESECTION OF BLADDER TUMOR WITH MITOMYCIN-C
Anesthesia: General | Site: Bladder

## 2022-04-22 MED ORDER — ONDANSETRON HCL 4 MG/2ML IJ SOLN
INTRAMUSCULAR | Status: AC
  Filled 2022-04-22: qty 2

## 2022-04-22 MED ORDER — STERILE WATER FOR IRRIGATION IR SOLN
Status: DC | PRN
  Administered 2022-04-22: 250 mL

## 2022-04-22 MED ORDER — ONDANSETRON HCL 4 MG/2ML IJ SOLN
INTRAMUSCULAR | Status: DC | PRN
  Administered 2022-04-22: 4 mg via INTRAVENOUS

## 2022-04-22 MED ORDER — SUGAMMADEX SODIUM 200 MG/2ML IV SOLN
INTRAVENOUS | Status: DC | PRN
  Administered 2022-04-22: 200 mg via INTRAVENOUS

## 2022-04-22 MED ORDER — DEXAMETHASONE SODIUM PHOSPHATE 10 MG/ML IJ SOLN
INTRAMUSCULAR | Status: DC | PRN
  Administered 2022-04-22: 4 mg via INTRAVENOUS

## 2022-04-22 MED ORDER — DEXAMETHASONE SODIUM PHOSPHATE 10 MG/ML IJ SOLN
INTRAMUSCULAR | Status: AC
  Filled 2022-04-22: qty 1

## 2022-04-22 MED ORDER — 0.9 % SODIUM CHLORIDE (POUR BTL) OPTIME
TOPICAL | Status: DC | PRN
  Administered 2022-04-22: 1000 mL

## 2022-04-22 MED ORDER — FENTANYL CITRATE (PF) 100 MCG/2ML IJ SOLN
INTRAMUSCULAR | Status: DC | PRN
  Administered 2022-04-22 (×5): 25 ug via INTRAVENOUS

## 2022-04-22 MED ORDER — PHENYLEPHRINE HCL-NACL 20-0.9 MG/250ML-% IV SOLN
INTRAVENOUS | Status: AC
  Filled 2022-04-22: qty 250

## 2022-04-22 MED ORDER — ESMOLOL HCL 100 MG/10ML IV SOLN
INTRAVENOUS | Status: AC
  Filled 2022-04-22: qty 10

## 2022-04-22 MED ORDER — FENTANYL CITRATE (PF) 100 MCG/2ML IJ SOLN
INTRAMUSCULAR | Status: AC
  Filled 2022-04-22: qty 2

## 2022-04-22 MED ORDER — HYDRALAZINE HCL 20 MG/ML IJ SOLN
INTRAMUSCULAR | Status: AC
  Filled 2022-04-22: qty 1

## 2022-04-22 MED ORDER — ACETAMINOPHEN 500 MG PO TABS
1000.0000 mg | ORAL_TABLET | Freq: Once | ORAL | Status: AC
  Administered 2022-04-22: 1000 mg via ORAL
  Filled 2022-04-22: qty 2

## 2022-04-22 MED ORDER — LABETALOL HCL 5 MG/ML IV SOLN
INTRAVENOUS | Status: DC | PRN
  Administered 2022-04-22 (×2): 5 mg via INTRAVENOUS

## 2022-04-22 MED ORDER — ESMOLOL HCL 100 MG/10ML IV SOLN
INTRAVENOUS | Status: DC | PRN
  Administered 2022-04-22: 20 mg via INTRAVENOUS
  Administered 2022-04-22: 30 mg via INTRAVENOUS

## 2022-04-22 MED ORDER — HYDRALAZINE HCL 20 MG/ML IJ SOLN
5.0000 mg | INTRAMUSCULAR | Status: DC | PRN
  Administered 2022-04-22: 5 mg via INTRAVENOUS

## 2022-04-22 MED ORDER — SODIUM CHLORIDE 0.9 % IR SOLN
Status: DC | PRN
  Administered 2022-04-22: 6000 mL

## 2022-04-22 MED ORDER — LIDOCAINE 2% (20 MG/ML) 5 ML SYRINGE
INTRAMUSCULAR | Status: DC | PRN
  Administered 2022-04-22: 60 mg via INTRAVENOUS

## 2022-04-22 MED ORDER — HYDRALAZINE HCL 20 MG/ML IJ SOLN: 2.0000 mg | INTRAMUSCULAR | Status: DC | PRN

## 2022-04-22 MED ORDER — ROCURONIUM BROMIDE 10 MG/ML (PF) SYRINGE
PREFILLED_SYRINGE | INTRAVENOUS | Status: AC
  Filled 2022-04-22: qty 10

## 2022-04-22 MED ORDER — CEFAZOLIN SODIUM-DEXTROSE 2-4 GM/100ML-% IV SOLN
2.0000 g | INTRAVENOUS | Status: AC
  Administered 2022-04-22: 2 g via INTRAVENOUS
  Filled 2022-04-22: qty 100

## 2022-04-22 MED ORDER — FENTANYL CITRATE PF 50 MCG/ML IJ SOSY: 25.0000 ug | PREFILLED_SYRINGE | INTRAMUSCULAR | Status: DC | PRN

## 2022-04-22 MED ORDER — LABETALOL HCL 5 MG/ML IV SOLN
INTRAVENOUS | Status: AC
  Filled 2022-04-22: qty 4

## 2022-04-22 MED ORDER — LACTATED RINGERS IV SOLN: INTRAVENOUS | Status: DC | PRN

## 2022-04-22 MED ORDER — SODIUM CHLORIDE 0.9% FLUSH: 3.0000 mL | Freq: Two times a day (BID) | INTRAVENOUS | Status: DC

## 2022-04-22 MED ORDER — PROPOFOL 10 MG/ML IV BOLUS
INTRAVENOUS | Status: DC | PRN
  Administered 2022-04-22: 100 mg via INTRAVENOUS

## 2022-04-22 MED ORDER — HYDRALAZINE HCL 20 MG/ML IJ SOLN
INTRAMUSCULAR | Status: AC
  Administered 2022-04-22: 5 mg via INTRAVENOUS
  Filled 2022-04-22: qty 1

## 2022-04-22 MED ORDER — ROCURONIUM BROMIDE 10 MG/ML (PF) SYRINGE
PREFILLED_SYRINGE | INTRAVENOUS | Status: DC | PRN
  Administered 2022-04-22: 50 mg via INTRAVENOUS

## 2022-04-22 MED ORDER — PROPOFOL 10 MG/ML IV BOLUS
INTRAVENOUS | Status: AC
  Filled 2022-04-22: qty 20

## 2022-04-22 SURGICAL SUPPLY — 22 items
BAG URINE DRAIN 2000ML AR STRL (UROLOGICAL SUPPLIES) IMPLANT
BAG URO CATCHER STRL LF (MISCELLANEOUS) ×1 IMPLANT
CATH FOLEY 2WAY SLVR  5CC 20FR (CATHETERS) ×1
CATH FOLEY 2WAY SLVR 5CC 20FR (CATHETERS) IMPLANT
CATH FOLEY 3WAY 30CC 22FR (CATHETERS) IMPLANT
CATH URETL OPEN 5X70 (CATHETERS) IMPLANT
DRAPE FOOT SWITCH (DRAPES) ×1 IMPLANT
ELECT REM PT RETURN 15FT ADLT (MISCELLANEOUS) ×1 IMPLANT
GLOVE SURG SS PI 8.0 STRL IVOR (GLOVE) ×1 IMPLANT
GOWN STRL REUS W/ TWL XL LVL3 (GOWN DISPOSABLE) ×1 IMPLANT
GOWN STRL REUS W/TWL XL LVL3 (GOWN DISPOSABLE) ×1
HOLDER FOLEY CATH W/STRAP (MISCELLANEOUS) IMPLANT
KIT TURNOVER KIT A (KITS) ×1 IMPLANT
LOOP CUT BIPOLAR 24F LRG (ELECTROSURGICAL) IMPLANT
MANIFOLD NEPTUNE II (INSTRUMENTS) ×1 IMPLANT
PACK CYSTO (CUSTOM PROCEDURE TRAY) ×1 IMPLANT
SUT ETHILON 3 0 PS 1 (SUTURE) IMPLANT
SYR 30ML LL (SYRINGE) IMPLANT
SYR TOOMEY IRRIG 70ML (MISCELLANEOUS)
SYRINGE TOOMEY IRRIG 70ML (MISCELLANEOUS) IMPLANT
TUBING CONNECTING 10 (TUBING) ×1 IMPLANT
TUBING UROLOGY SET (TUBING) ×1 IMPLANT

## 2022-04-22 NOTE — Interval H&P Note (Signed)
History and Physical Interval Note:  04/22/2022 12:53 PM  Traci Powell  has presented today for surgery, with the diagnosis of BLADDER TUMOR.  The various methods of treatment have been discussed with the patient and family. After consideration of risks, benefits and other options for treatment, the patient has consented to  Procedure(s): TRANSURETHRAL RESECTION OF BLADDER TUMOR WITH POSSIBLE GEMCITABINE (N/A) as a surgical intervention.  The patient's history has been reviewed, patient examined, no change in status, stable for surgery.  I have reviewed the patient's chart and labs.  Questions were answered to the patient's satisfaction.     Irine Seal

## 2022-04-22 NOTE — Anesthesia Procedure Notes (Signed)
Procedure Name: Intubation Date/Time: 04/22/2022 1:11 PM  Performed by: West Pugh, CRNAPre-anesthesia Checklist: Patient identified, Emergency Drugs available, Suction available, Patient being monitored and Timeout performed Patient Re-evaluated:Patient Re-evaluated prior to induction Oxygen Delivery Method: Circle system utilized Preoxygenation: Pre-oxygenation with 100% oxygen Induction Type: IV induction Ventilation: Mask ventilation without difficulty Laryngoscope Size: 3 and Glidescope Grade View: Grade I Tube type: Oral Tube size: 7.0 mm Number of attempts: 1 Airway Equipment and Method: Rigid stylet and Video-laryngoscopy Placement Confirmation: ETT inserted through vocal cords under direct vision, positive ETCO2, CO2 detector and breath sounds checked- equal and bilateral Secured at: 22 cm Tube secured with: Tape Dental Injury: Teeth and Oropharynx as per pre-operative assessment  Comments: Elective glidescope utilized due reduced neck mobility and prior cervical spine surgery.

## 2022-04-22 NOTE — Progress Notes (Signed)
Report given to patient's daughter Brayton Layman over the phone as well as [employer] phone [phone]. I spoke with Zack Seal LPN gave her report over the phone. Transport contacted for pickup bring patient back to [facility]. She was discharged to Facility with instructions to remove f/c tomorrow if urine if clear. Leave f/c if urine not clear and contact Dr.Wrenn for further orders.

## 2022-04-22 NOTE — Transfer of Care (Signed)
Immediate Anesthesia Transfer of Care Note  Patient: Adilynn Bessey  Procedure(s) Performed: TRANSURETHRAL RESECTION OF BLADDER TUMOR (Bladder)  Patient Location: PACU  Anesthesia Type:General  Level of Consciousness: awake and patient cooperative  Airway & Oxygen Therapy: Patient Spontanous Breathing and Patient connected to face mask oxygen  Post-op Assessment: Report given to RN and Post -op Vital signs reviewed and stable  Post vital signs: Reviewed and stable  Last Vitals:  Vitals Value Taken Time  BP 218/99 04/22/22 1404  Temp 36.6 C 04/22/22 1404  Pulse 68 04/22/22 1404  Resp 12 04/22/22 1409  SpO2 100 % 04/22/22 1404  Vitals shown include unvalidated device data.  Last Pain:  Vitals:   04/22/22 1404  TempSrc:   PainSc: 0-No pain         Complications: No notable events documented.

## 2022-04-22 NOTE — Discharge Instructions (Signed)
I would like for the foley catheter to stay in until tomorrow morning.

## 2022-04-22 NOTE — Op Note (Signed)
Procedure: Cystoscopy with transurethral resection of bladder tumor.  Preop diagnosis: Gross hematuria with bladder neoplasm.  Postop diagnosis: Same with tumors involving the right lateral wall and trigone with a resection dimension of 3 x 5 cm.  Surgeon: Dr. Irine Seal.  Anesthesia: General.  Specimen: Bladder tumor chips.  Drains: 20 French Foley catheter.  EBL: None.  Complications: None.  Indications: The patient is a 81 year old female whose had chronic gross hematuria.  She has a contrast allergy so a noncontrasted CT was obtained which demonstrated normal kidneys without obstruction but the bladder was not well visualized as it was decompressed.  Subsequent cystoscopy in the office demonstrated bladder tumors of the right lateral wall and trigone that appeared somewhat sessile.  It was felt that TURBT with possible gemcitabine was indicated.  Procedure: She was taken operating room where general anesthetic was induced.  She was intubated and paralyzed.  She was given Ancef.  She was placed in lithotomy position and fitted with PAS hose.  Perineum and genitalia were prepped with Betadine solution.  She was draped in usual sterile fashion.  Cystoscopy was performed using the 21 [place] scope and 30 degree lens.  Examination revealed a normal urethra.  The bladder wall had mild trabeculation with some increased vascularity posteriorly on the right lateral wall there was a sessile appearing mass approximately 1 and half to 2 cm in size with surrounding erythema extending out approximately 3 x 5 cm in size down to the right trigone where there was a small tumor superior to the right ureteral orifice.  None of the tumors were truly papillary.  The ureters were otherwise unremarkable.   The cystoscope was replaced with a 26 French continuous-flow resectoscope sheath which was fitted within the [place] handle, bipolar loop and the 30 degree lens.  Saline was used as the irrigant.  The tumor  was then resected into the muscle on the right lateral wall and the lesion superior to the ureteral orifice was also resected.  Significant erythematous and slightly irregular mucosa around the these lesions was generously fulgurated.  Once hemostasis was achieved, the bladder was evacuated free of chips.  Inspection demonstrated some visible fat but no perforation in the area of the primary resection..  There was some increased vascularity across the floor of the bladder and onto the left lateral wall but it appeared more vascular and less neoplastic.  Once final inspection demonstrated no active bleeding or retained tissue, the cystoscope was removed and the 20 French Foley catheter was inserted.  This was filled with 10 mL of sterile fluid and placed to straight drainage.  She was taken down for lithotomy position, her anesthetic was reversed and she was moved to recovery room in stable condition.  There were no complications.

## 2022-04-23 ENCOUNTER — Encounter (HOSPITAL_COMMUNITY): Payer: Self-pay | Admitting: Urology

## 2022-04-23 LAB — SURGICAL PATHOLOGY

## 2022-04-23 NOTE — Anesthesia Postprocedure Evaluation (Signed)
Anesthesia Post Note  Patient: Traci Powell  Procedure(s) Performed: TRANSURETHRAL RESECTION OF BLADDER TUMOR (Bladder)     Patient location during evaluation: PACU Anesthesia Type: General Level of consciousness: awake and alert Pain management: pain level controlled Vital Signs Assessment: post-procedure vital signs reviewed and stable Respiratory status: spontaneous breathing, nonlabored ventilation and respiratory function stable Cardiovascular status: blood pressure returned to baseline and stable Postop Assessment: no apparent nausea or vomiting Anesthetic complications: no  No notable events documented.  Last Vitals:  Vitals:   04/22/22 1515 04/22/22 1526  BP: (!) 143/69 (!) 158/79  Pulse: 67 76  Resp: 13 18  Temp:  36.6 C  SpO2: 96% 98%    Last Pain:  Vitals:   04/22/22 1526  TempSrc: Oral  PainSc: 0-No pain                 Chrishawn Kring,W. EDMOND

## 2022-04-27 IMAGING — MR MR HEAD W/O CM
10 series · 48 of 48 positions shown · non-contrast
Comparison: CT head August 19, 2020.

CLINICAL DATA: Neuro deficit, acute stroke suspected.

EXAM:
MRI HEAD WITHOUT CONTRAST
TECHNIQUE: Multiplanar, multiecho pulse sequences of the brain and surrounding
structures were obtained without intravenous contrast.

[Series 5: DWI · axial · 3.0mm · 1.36mm/px · z∈[-56,+96]mm · 11 of 104 slices shown (1 of 4)]
[im 1/104]
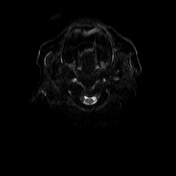
[im 11/104]
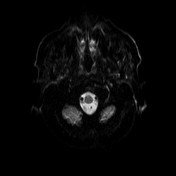
[im 21/104]
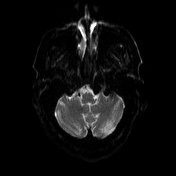
[im 31/104]
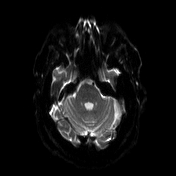
[im 42/104]
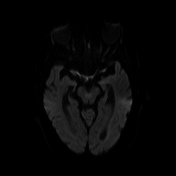
[im 52/104]
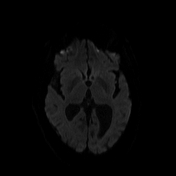
[im 62/104]
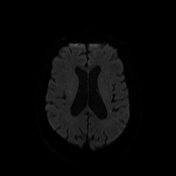
[im 73/104]
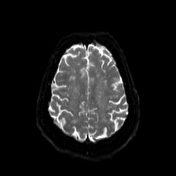
[im 83/104]
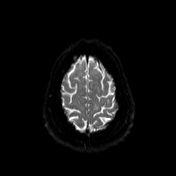
[im 93/104]
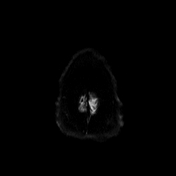
[im 104/104]
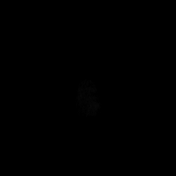

[Series 6: DWI · axial · 3.0mm · 1.36mm/px · z∈[-56,+96]mm · 5 of 52 slices shown (2 of 4)]
[im 1/52]
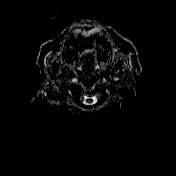
[im 13/52]
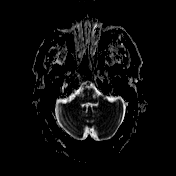
[im 26/52]
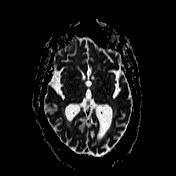
[im 39/52]
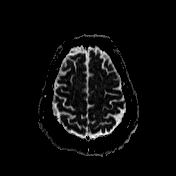
[im 52/52]
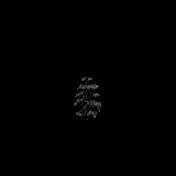

[Series 7: T1 · sagittal · 5.0mm · 0.75mm/px · 2 of 24 slices shown (1 of 2)]
[im 1/24]
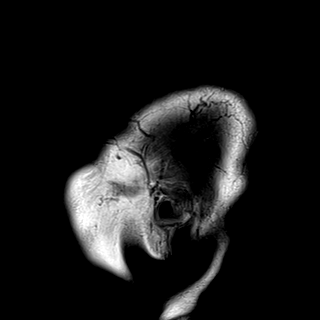
[im 24/24]
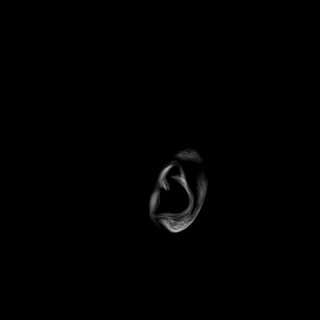

[Series 8: T2 · axial · 5.0mm · 0.62mm/px · z∈[-60,+102]mm · 3 of 26 slices shown (1 of 2)]
[im 1/26]
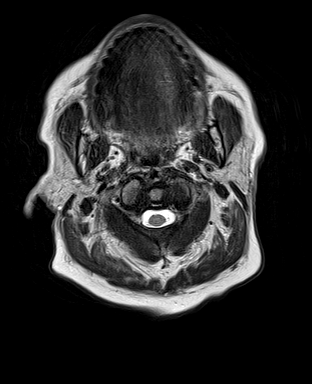
[im 13/26]
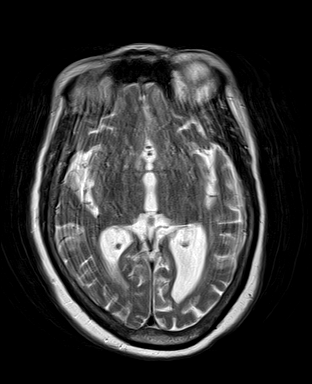
[im 26/26]
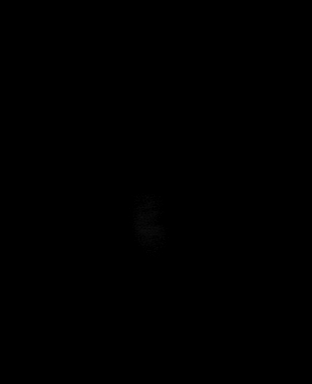

[Series 9: FLAIR · axial · 3.0mm · 0.86mm/px · z∈[-54,+96]mm · 5 of 51 slices shown]
[im 1/51]
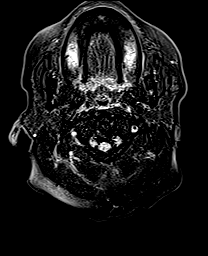
[im 13/51]
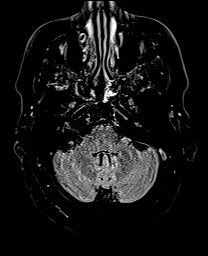
[im 26/51]
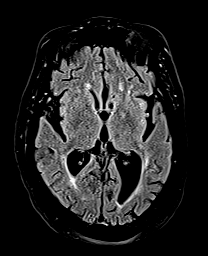
[im 38/51]
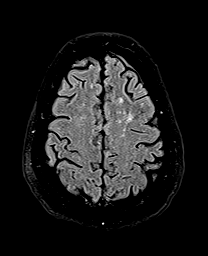
[im 51/51]
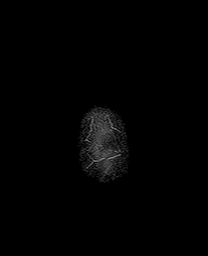

[Series 10: T1 · axial · 3.0mm · 0.45mm/px · z∈[-54,+102]mm · 5 of 53 slices shown (2 of 2)]
[im 1/53]
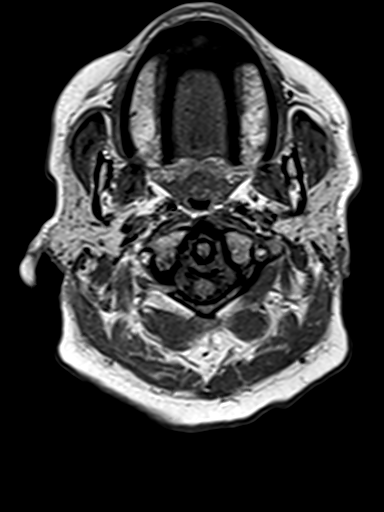
[im 14/53]
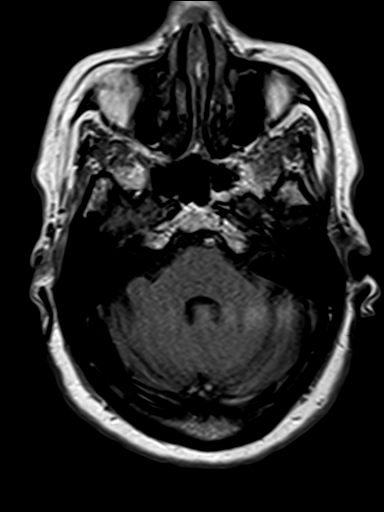
[im 27/53]
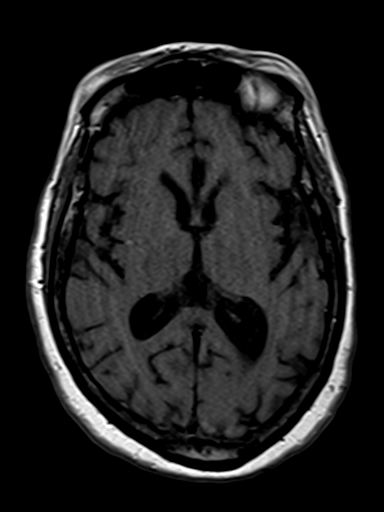
[im 40/53]
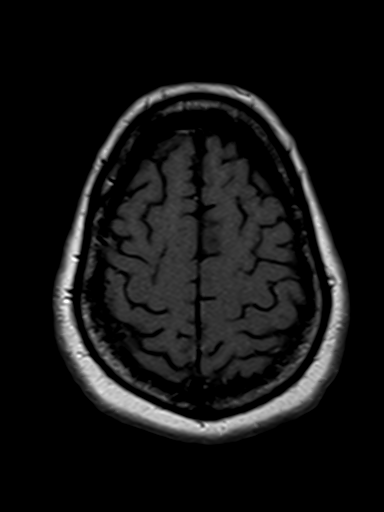
[im 53/53]
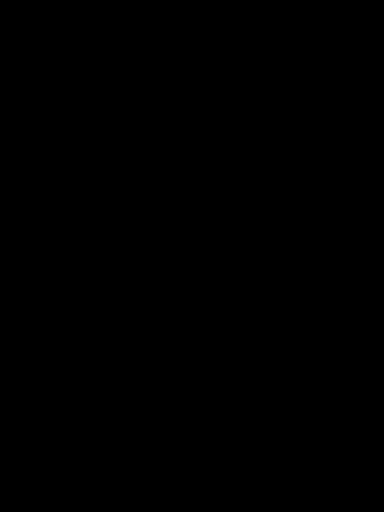

[Series 11: DWI · coronal · 5.0mm · 1.31mm/px · 6 of 64 slices shown (3 of 4)]
[im 1/64]
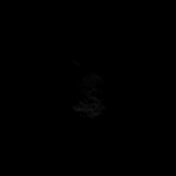
[im 13/64]
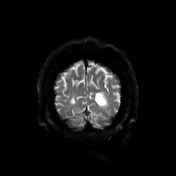
[im 26/64]
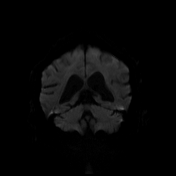
[im 38/64]
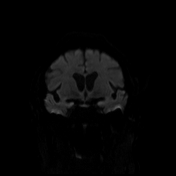
[im 51/64]
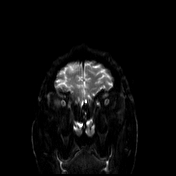
[im 64/64]
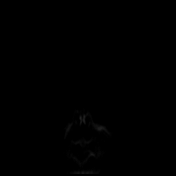

[Series 12: DWI · coronal · 5.0mm · 1.31mm/px · 3 of 32 slices shown (4 of 4)]
[im 1/32]
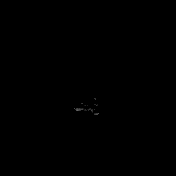
[im 16/32]
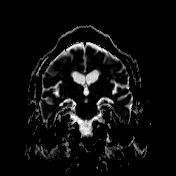
[im 32/32]
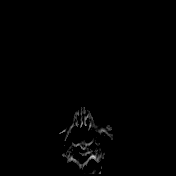

[Series 13: GRE · axial · 3.0mm · 0.51mm/px · z∈[-58,+94]mm · 5 of 52 slices shown]
[im 1/52]
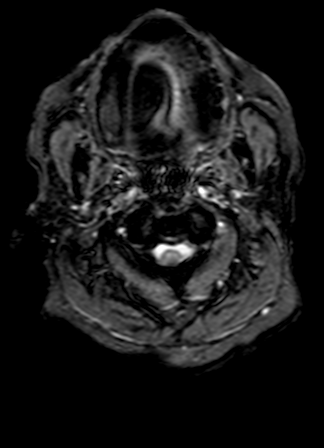
[im 13/52]
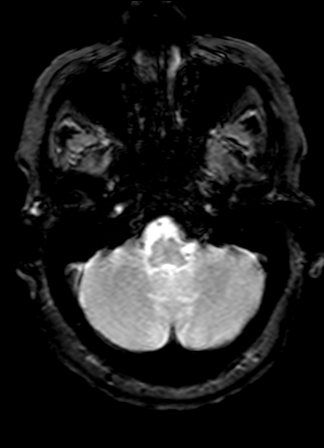
[im 26/52]
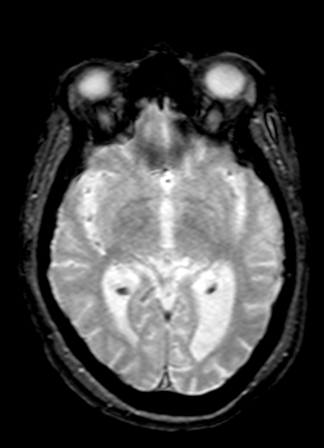
[im 39/52]
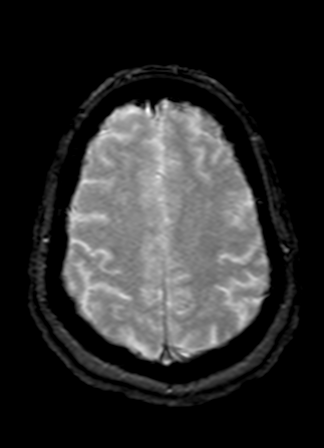
[im 52/52]
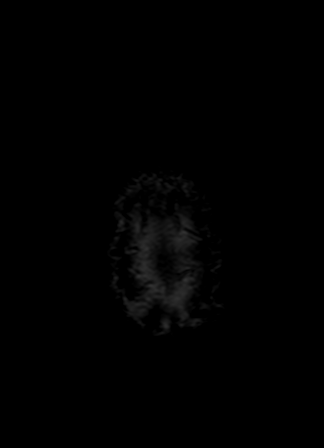

[Series 14: T2 · coronal · 5.0mm · 0.86mm/px · 3 of 27 slices shown (2 of 2)]
[im 1/27]
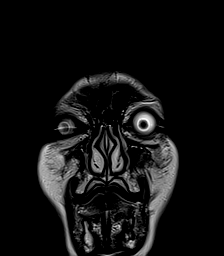
[im 14/27]
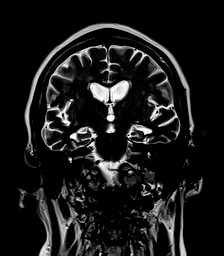
[im 27/27]
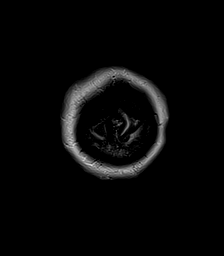

[48 of 48 positions shown; findings below may reference images not displayed]

FINDINGS: Motion limited study.  Within this limitation:

Brain: No acute infarct. No acute hemorrhage. No hydrocephalus.
Moderate generalized cerebral atrophy with ex vacuo ventricular
dilation. No extra-axial fluid collection. No mass lesion or
abnormal mass effect. Moderate patchy T2/FLAIR hyperintensities
within the white matter, most likely related to chronic
microvascular ischemic disease.

Vascular: Major arterial flow voids are maintained at the skull
base.

Skull and upper cervical spine: Normal marrow signal. Partially
imaged degenerative changes of the cervical spine with suspected
moderate canal stenosis C3-C4.

Sinuses/Orbits: Mucosal thickening of ethmoid air cells with
opacification of a posterior right ethmoid air cell. Unremarkable
orbits.

Other: No mastoid effusions.
IMPRESSION: 1. No evidence of acute intracranial abnormality on this motion
limited study.
2. Moderate chronic microvascular ischemic disease and generalized
cerebral volume loss.
3. Partially imaged degenerative changes of the cervical spine with
suspected moderate canal stenosis C3-C4. MRI of the cervical spine
could further characterize if clinically indicated.

## 2022-04-27 NOTE — Progress Notes (Signed)
GU Location of Tumor / Histology: Bladder Ca  Biopsies       Past/Anticipated interventions by urology, if any:     Follow up appointment with Dr. Jeffie Pollock on 05/01/2022 Dr. Jeffie Pollock 04/22/2022 Transurethral Resection of Bladder Tumor    Past/Anticipated interventions by medical oncology, if any:  NA  Weight changes, if any:  Weight loss of 10 lbs, poor appetite per daughter.  Bowel/Bladder complaints, if any:  No is on stool softeners per daughter no issue at this time.  Initially had hematuria but not present at this time since surgical procedure.  Nausea/Vomiting, if any:  No  Pain issues, if any:  0/10  SAFETY ISSUES: Prior radiation?  Yes, 2006-2008 Colon cancer per daughter. Pacemaker/ICD?  No Possible current pregnancy?  Hysterectomy Is the patient on methotrexate? No  Current Complaints / other details:  Need more information about radiation treatment.

## 2022-04-28 ENCOUNTER — Ambulatory Visit
Admission: RE | Admit: 2022-04-28 | Discharge: 2022-04-28 | Disposition: A | Discharge status: Home / Self Care | Payer: Medicare PPO | Source: Ambulatory Visit | Attending: Radiation Oncology | Admitting: Radiation Oncology

## 2022-04-28 VITALS — BP 148/65 | HR 70 | Temp 97.7°F | Resp 20 | Ht 65.0 in | Wt 140.8 lb

## 2022-04-28 DIAGNOSIS — F02818 Dementia in other diseases classified elsewhere, unspecified severity, with other behavioral disturbance: Secondary | ICD-10-CM | POA: Insufficient documentation

## 2022-04-28 DIAGNOSIS — N189 Chronic kidney disease, unspecified: Secondary | ICD-10-CM | POA: Insufficient documentation

## 2022-04-28 DIAGNOSIS — D414 Neoplasm of uncertain behavior of bladder: Secondary | ICD-10-CM

## 2022-04-28 DIAGNOSIS — Z8616 Personal history of COVID-19: Secondary | ICD-10-CM | POA: Insufficient documentation

## 2022-04-28 DIAGNOSIS — C678 Malignant neoplasm of overlapping sites of bladder: Secondary | ICD-10-CM

## 2022-04-28 DIAGNOSIS — Z87891 Personal history of nicotine dependence: Secondary | ICD-10-CM | POA: Diagnosis not present

## 2022-04-28 DIAGNOSIS — Z79899 Other long term (current) drug therapy: Secondary | ICD-10-CM | POA: Insufficient documentation

## 2022-04-28 DIAGNOSIS — G4700 Insomnia, unspecified: Secondary | ICD-10-CM | POA: Insufficient documentation

## 2022-04-28 NOTE — Progress Notes (Signed)
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    ICD-10-CM   1. Neoplasm of uncertain behavior of bladder  D41.4     2. Cancer of overlapping sites of bladder [facility])  C67.8       HISTORY OF PRESENT ILLNESS: Traci Powell is a 81 y.o. female seen at the request of Dr. Jeffie Pollock.  She initially presented to Dr. Jeffie Pollock on 03/30/2022 for evaluation of gross hematuria ongoing since October 2023.  She had been treated with multiple antibiotics for suspected UTIs but the gross hematuria persisted, intermittently.  She is a previous smoker, quit 15 years ago.  She also has a history of colon cancer status post partial colectomy with colostomy and then takedown and also has significant dementia.  She had a CT A/P stone protocol study in the urology office on 03/30/2022 and this was negative for any acute findings.  She subsequently underwent an office cystoscopy on 04/13/2022 which revealed a 5 mm tumor at the right trigone and a 1.5 cm tumor at the right lateral bladder wall.  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  She has been kindly referred to [place] today to discuss potential radiotherapy options for the management of her disease.  She has a follow-up visit scheduled with Dr. Jeffie Pollock on Friday, 04/30/2022.  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)    Cervical stenosis of spine    CKD (chronic kidney disease)    Gait disorder    Insomnia    Personal history of COVID-19 08/2020      PAST SURGICAL HISTORY: Past  Surgical History:  Procedure Laterality Date   ABDOMINAL HYSTERECTOMY     COLON SURGERY  2007   resection W/ ileostomy and reversal   TRANSURETHRAL RESECTION OF BLADDER TUMOR WITH MITOMYCIN-C N/A 04/22/2022   Procedure: TRANSURETHRAL RESECTION OF BLADDER TUMOR;  Surgeon: Irine Seal, MD;  Location: [facility];  Service: Urology;  Laterality: N/A;    FAMILY HISTORY: No family history on file.  SOCIAL HISTORY:  Social History   Socioeconomic History   Marital status: Widowed    Spouse name: Not on file   Number of children: Not on file   Years of education: Not on file   Highest education level: Not on file  Occupational History   Not on file  Tobacco Use   Smoking status: Former    Packs/day: 0.25    Years: 5.00    Total pack years: 1.25    Types: Cigarettes    Quit date: 04/20/1998    Years since quitting: 24.0   Smokeless tobacco: Never  Substance and Sexual Activity   Alcohol use: Never   Drug use: Never   Sexual activity: Not on file  Other Topics Concern   Not on file  Social History Narrative   Not on file   Social Determinants of Health   Financial Resource Strain: Not on file  Food Insecurity: Not on file  Transportation Needs: Not on file  Physical Activity: Not on file  Stress: Not on file  Social Connections: Not on file  Intimate Partner Violence: Not on file    ALLERGIES: Iodinated contrast media  MEDICATIONS:  Current Outpatient Medications  Medication Sig Dispense Refill   acetaminophen (TYLENOL) 500 MG tablet Take 1,000 mg by mouth in the morning and at bedtime.     amLODipine (NORVASC) 2.5 MG tablet Take 2.5 mg by mouth daily.     diclofenac Sodium (VOLTAREN) 1 % GEL Apply 3 g topically in the morning and at bedtime.     divalproex (DEPAKOTE ER) 250 MG 24 hr tablet Take 250 mg by mouth at bedtime.     docusate sodium (COLACE) 100 MG capsule Take 100 mg by mouth at bedtime.     lisinopril (ZESTRIL) 20 MG tablet Take 20 mg by mouth daily.      melatonin 3 MG TABS tablet Take 3 mg by mouth at bedtime.     memantine (NAMENDA) 10 MG tablet Take 10 mg by mouth 2 (two) times daily.     Menthol, Topical Analgesic, (BIOFREEZE) 10 % CREA Apply 1 Application topically in the morning and at bedtime. Apply to knees     pravastatin (PRAVACHOL) 10 MG tablet Take 10 mg by mouth daily.     traMADol (ULTRAM) 50 MG tablet Take 25 mg by mouth in the morning, at noon, and at bedtime.     traZODone (DESYREL) 50 MG tablet Take 50 mg by mouth at bedtime.     No current facility-administered medications for this encounter.    REVIEW OF SYSTEMS:  On review of systems, obtained via the patient's daughter due to her significant dementia, the patient seems to be doing well overall.  She has not complained of any chest pain, shortness of breath, cough, fevers, chills, night sweats, or unintended weight changes.  She denies any bowel, abdominal pain, nausea or vomiting.  She has had persistent intermittent gross hematuria with increased frequency and urgency but has not complained of dysuria, straining to void or incomplete emptying.  She denies any new musculoskeletal or joint aches or pains. A complete review of systems is obtained and is otherwise negative.    PHYSICAL EXAM:  Wt Readings from Last 3 Encounters:  04/28/22 140 lb 12.8 oz (63.9 kg)  04/22/22 145 lb 8.1 oz (66 kg)  04/20/22 145 lb 8.1 oz (66 kg)   Temp Readings from Last 3 Encounters:  04/28/22 97.7 F (36.5 C)  04/22/22 97.8 F (36.6 C) (Oral)  04/20/22 97.8 F (36.6 C) (Oral)   BP Readings from Last 3 Encounters:  04/28/22 (!) 148/65  04/22/22 (!) 158/79  04/20/22 (!) 185/71   Pulse Readings from Last 3 Encounters:  04/28/22 70  04/22/22 76  04/20/22 60   Pain Assessment Pain Score: 0-No pain/10  In general this is a well appearing African-American female in no acute distress.  She's alert and oriented to self and appropriate throughout the examination.  She has severe  dementia and asks where she is at and why, multiple times throughout our conversation.  Cardiopulmonary assessment is negative for acute distress and she exhibits normal effort.   KPS = 60  100 - Normal; no complaints; no evidence of disease. 90   - Able to carry on normal activity; minor signs or symptoms of disease. 80   - Normal activity with effort; some signs or symptoms of disease. 56   - Cares for self; unable to carry on normal activity or to do active work. 60   - Requires occasional assistance, but is able to care  for most of his personal needs. 50   - Requires considerable assistance and frequent medical care. 74   - Disabled; requires special care and assistance. 56   - Severely disabled; hospital admission is indicated although death not imminent. 21   - Very sick; hospital admission necessary; active supportive treatment necessary. 10   - Moribund; fatal processes progressing rapidly. 0     - Dead  Karnofsky DA, Abelmann [place], Craver LS and Burchenal [facility] [phone]) The use of the nitrogen mustards in the palliative treatment of carcinoma: with particular reference to bronchogenic carcinoma Cancer 1 634-56  LABORATORY DATA:  Lab Results  Component Value Date   WBC 6.4 04/22/2022   HGB 12.8 04/22/2022   HCT 38.3 04/22/2022   MCV 86.5 04/22/2022   PLT 344 04/22/2022   Lab Results  Component Value Date   NA 144 04/22/2022   K 4.0 04/22/2022   CL 107 04/22/2022   CO2 29 04/22/2022   Lab Results  Component Value Date   ALT 12 04/22/2022   AST 18 04/22/2022   ALKPHOS 60 04/22/2022   BILITOT 0.6 04/22/2022     RADIOGRAPHY: No results found.    IMPRESSION/PLAN: 1. 81 y.o. female with muscle invasive bladder cancer presenting with gross hematuria. Today, we talked to the patient and her daughter about the findings and workup thus far. We discussed the natural history of muscle invasive urothelial carcinoma of the bladder and general treatment, highlighting the role of  radiotherapy in the management.  We discussed that the standard of care for managing muscle invasive bladder cancer in patients that are not surgical candidates would be to proceed with a definitive course of chemoradiation.  However, in patients that are not good candidates for chemotherapy, we could still offer a course of definitive radiotherapy alone either delivered over the course of 36 daily treatments or a split course of radiation with 2 weeks of daily treatments followed by a 3-week break and pending the radiation treatments are well-tolerated, we would then resume an additional 2 to 3-week course of daily bladder radiation.  They were informed that there are new immunotherapy treatment options in the management of bladder cancer and these could be used upfront along with the radiation treatments or potentially reserved for later, if the cancer returned after completing a definitive course of radiation.  We also discussed a more palliative approach utilizing a shorter, 2-week course of daily radiotherapy to temporarily slow the progression of disease and help manage the gross hematuria.  We discussed the available radiation techniques, and focused on the details and logistics of delivery. We reviewed the anticipated acute and late sequelae associated with each of the radiation treatment options.  We feel that given the patient's significant dementia, she would most likely tolerate the split course of bladder radiation best, if they wish to pursue a curative treatment approach.  We do not feel that she would tolerate systemic therapy well but immunotherapy could possibly be an option, now or later.  The patient and her daughter were encouraged to ask questions that were answered to their stated satisfaction.  At the conclusion of our conversation, they would like to take some additional time to consider the options and discuss these options with other family members.  They are also interested in Dr.  Ralene Muskrat recommendation and will meet with him on Friday, 04/30/2022 for further discussion.  We will share our discussion with Dr. Jeffie Pollock and if they are interested in learning more about the  immunotherapy options and Dr. Jeffie Pollock is in agreement, a referral could be made for a consult visit with Dr. Benay Spice in medical oncology for further discussion.  Her daughter appears to have a good understanding of her mother's disease and our treatment recommendations which are of curative intent.  They have our contact information and will let us know once the reach a final decision so that we can proceed with treatment planning accordingly at that time.  We enjoyed meeting the patient and her daughter today and look forward to continue to participate in her care.   We personally spent 70 minutes in this encounter including chart review, reviewing radiological studies, meeting face-to-face with the patient, entering orders and completing documentation.    Nicholos Johns, PA-C    Tyler Pita, MD  [facility] Direct Dial: [phone]  Fax: [phone] [email or web address]  Skype  LinkedIn

## 2022-05-11 ENCOUNTER — Other Ambulatory Visit: Payer: Self-pay

## 2022-05-11 DIAGNOSIS — C678 Malignant neoplasm of overlapping sites of bladder: Secondary | ICD-10-CM

## 2022-05-12 ENCOUNTER — Inpatient Hospital Stay: Payer: Medicare PPO

## 2022-05-12 ENCOUNTER — Other Ambulatory Visit: Payer: Self-pay

## 2022-05-12 ENCOUNTER — Inpatient Hospital Stay: Payer: Medicare PPO | Attending: Internal Medicine | Admitting: Internal Medicine

## 2022-05-12 VITALS — BP 210/81 | HR 66 | Temp 98.4°F | Resp 16 | Wt 143.7 lb

## 2022-05-12 DIAGNOSIS — Z8616 Personal history of COVID-19: Secondary | ICD-10-CM | POA: Diagnosis not present

## 2022-05-12 DIAGNOSIS — Z803 Family history of malignant neoplasm of breast: Secondary | ICD-10-CM | POA: Insufficient documentation

## 2022-05-12 DIAGNOSIS — C679 Malignant neoplasm of bladder, unspecified: Secondary | ICD-10-CM | POA: Diagnosis present

## 2022-05-12 DIAGNOSIS — N189 Chronic kidney disease, unspecified: Secondary | ICD-10-CM | POA: Insufficient documentation

## 2022-05-12 DIAGNOSIS — F02818 Dementia in other diseases classified elsewhere, unspecified severity, with other behavioral disturbance: Secondary | ICD-10-CM | POA: Diagnosis not present

## 2022-05-12 DIAGNOSIS — Z9221 Personal history of antineoplastic chemotherapy: Secondary | ICD-10-CM | POA: Diagnosis not present

## 2022-05-12 DIAGNOSIS — Z85048 Personal history of other malignant neoplasm of rectum, rectosigmoid junction, and anus: Secondary | ICD-10-CM | POA: Insufficient documentation

## 2022-05-12 DIAGNOSIS — Z923 Personal history of irradiation: Secondary | ICD-10-CM | POA: Insufficient documentation

## 2022-05-12 DIAGNOSIS — C678 Malignant neoplasm of overlapping sites of bladder: Secondary | ICD-10-CM

## 2022-05-12 DIAGNOSIS — G4700 Insomnia, unspecified: Secondary | ICD-10-CM | POA: Diagnosis not present

## 2022-05-12 DIAGNOSIS — M4802 Spinal stenosis, cervical region: Secondary | ICD-10-CM | POA: Diagnosis not present

## 2022-05-12 DIAGNOSIS — Z79899 Other long term (current) drug therapy: Secondary | ICD-10-CM | POA: Diagnosis not present

## 2022-05-12 DIAGNOSIS — Z5111 Encounter for antineoplastic chemotherapy: Secondary | ICD-10-CM

## 2022-05-12 DIAGNOSIS — Z87891 Personal history of nicotine dependence: Secondary | ICD-10-CM | POA: Insufficient documentation

## 2022-05-12 LAB — CMP (CANCER CENTER ONLY)
ALT: 13 U/L (ref 0–44)
AST: 15 U/L (ref 15–41)
Albumin: 4.3 g/dL (ref 3.5–5.0)
Alkaline Phosphatase: 68 U/L (ref 38–126)
Anion gap: 7 (ref 5–15)
BUN: 20 mg/dL (ref 8–23)
CO2: 31 mmol/L (ref 22–32)
Calcium: 10.5 mg/dL — ABNORMAL HIGH (ref 8.9–10.3)
Chloride: 107 mmol/L (ref 98–111)
Creatinine: 0.85 mg/dL (ref 0.44–1.00)
GFR, Estimated: >60 mL/min (ref >60)
Glucose, Bld: 124 mg/dL — ABNORMAL HIGH (ref 70–99)
Potassium: 4.4 mmol/L (ref 3.5–5.1)
Sodium: 145 mmol/L (ref 135–145)
Total Bilirubin: 0.3 mg/dL (ref 0.3–1.2)
Total Protein: 7.4 g/dL (ref 6.5–8.1)

## 2022-05-12 LAB — CBC WITH DIFFERENTIAL/PLATELET
Abs Immature Granulocytes: 0.01 10*3/uL (ref 0.00–0.07)
Basophils Absolute: 0 10*3/uL (ref 0.0–0.1)
Basophils Relative: 0 %
Eosinophils Absolute: 0.1 10*3/uL (ref 0.0–0.5)
Eosinophils Relative: 1 %
HCT: 36.9 % (ref 36.0–46.0)
Hemoglobin: 12.7 g/dL (ref 12.0–15.0)
Immature Granulocytes: 0 %
Lymphocytes Relative: 29 %
Lymphs Abs: 2 10*3/uL (ref 0.7–4.0)
MCH: 29.2 pg (ref 26.0–34.0)
MCHC: 34.4 g/dL (ref 30.0–36.0)
MCV: 84.8 fL (ref 80.0–100.0)
Monocytes Absolute: 0.7 10*3/uL (ref 0.1–1.0)
Monocytes Relative: 10 %
Neutro Abs: 4.2 10*3/uL (ref 1.7–7.7)
Neutrophils Relative %: 60 %
Platelets: 283 10*3/uL (ref 150–400)
RBC: 4.35 MIL/uL (ref 3.87–5.11)
RDW: 16.8 % — ABNORMAL HIGH (ref 11.5–15.5)
WBC: 6.9 10*3/uL (ref 4.0–10.5)
nRBC: 0 % (ref 0.0–0.2)

## 2022-05-12 MED ORDER — PROCHLORPERAZINE MALEATE 10 MG PO TABS: 10.0000 mg | ORAL_TABLET | Freq: Four times a day (QID) | ORAL | 0 refills | Status: DC | PRN

## 2022-05-12 NOTE — Progress Notes (Signed)
[place] [phone]   [phone]  CONSULT NOTE  REFERRING PHYSICIAN: Dr. Irine Seal  REASON FOR CONSULTATION:  81 years old African-American female recently diagnosed with urothelial carcinoma of the bladder  HPI Traci Powell is a 81 y.o. female with past medical history significant for chronic kidney disease, gait disorder, dementia, cervical stenosis, history of colorectal cancer in 2007 treated at [facility] with a course of concurrent chemoradiation followed by surgery.  The patient was complaining of hematuria since September 2023.  She was seen by her primary care physician and treated several times with a course of antibiotic for suspicious urinary tract infection.  She has no improvement in her symptoms and she was referred to urology and seen by Dr. Jeffie Pollock.  She had CT of the abdomen and pelvis on March 31, 2022 and it showed no evidence for urolithiasis, hydronephrosis or other acute findings.  The patient had cystoscopy with transurethral resection of bladder tumor under the care of Dr. Jeffie Pollock on April 22, 2022.  The final pathology [id number]) showed infiltrating high-grade urothelial carcinoma and the carcinoma invades the muscularis propria (detrusor muscle).  The patient was not a good candidate for surgical resection.  She was referred to Dr. Tammi Klippel and he had recommended a course of radiotherapy either as a single modality or any combination of chemotherapy.  The patient was referred to me today for evaluation and recommendation regarding her condition. When seen today she is feeling fine with no concerning complaints except for mild fatigue.  She denied having any dysuria, hematuria or increased frequency.  She has no nausea, vomiting, diarrhea or constipation.  She has no headache or visual changes.  She has no chest pain, shortness of breath, cough or hemoptysis.  She has no recent weight loss. Family history significant for mother  with diabetes mellitus and peripheral arterial disease and she had amputation of her legs. Father died from old age.  Sister had breast cancer. The patient is a widow and has 4 children.  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  She was accompanied by her daughter Traci Powell today.  She has no history of smoking except for many years ago.  No history of alcohol or drug abuse.  HPI  Past Medical History:  Diagnosis Date   Alzheimer's dementia ([place])    Cervical stenosis of spine    CKD (chronic kidney disease)    Gait disorder    Insomnia    Personal history of COVID-19 08/2020    Past Surgical History:  Procedure Laterality Date   ABDOMINAL HYSTERECTOMY     COLON SURGERY  2007   resection W/ ileostomy and reversal   TRANSURETHRAL RESECTION OF BLADDER TUMOR WITH MITOMYCIN-C N/A 04/22/2022   Procedure: TRANSURETHRAL RESECTION OF BLADDER TUMOR;  Surgeon: Irine Seal, MD;  Location: [facility];  Service: Urology;  Laterality: N/A;    No family history on file.  Social History Social History   Tobacco Use   Smoking status: Former    Packs/day: 0.25    Years: 5.00    Total pack years: 1.25    Types: Cigarettes    Quit date: 04/20/1998    Years since quitting: 24.0   Smokeless tobacco: Never  Substance Use Topics   Alcohol use: Never   Drug use: Never    Allergies  Allergen Reactions   Iodinated Contrast Media Hives    Redness, striking, and hives Has to take high dose  benadryl     Current Outpatient Medications  Medication Sig Dispense Refill   acetaminophen (TYLENOL) 500 MG tablet Take 1,000 mg by mouth in the morning and at bedtime.     amLODipine (NORVASC) 2.5 MG tablet Take 2.5 mg by mouth daily.     diclofenac Sodium (VOLTAREN) 1 % GEL Apply 3 g topically in the morning and at bedtime.     divalproex (DEPAKOTE ER) 250 MG 24 hr tablet Take 250 mg by mouth at bedtime.     docusate sodium (COLACE) 100 MG capsule Take 100 mg by mouth at bedtime.      lisinopril (ZESTRIL) 20 MG tablet Take 20 mg by mouth daily.     melatonin 3 MG TABS tablet Take 3 mg by mouth at bedtime.     memantine (NAMENDA) 10 MG tablet Take 10 mg by mouth 2 (two) times daily.     Menthol, Topical Analgesic, (BIOFREEZE) 10 % CREA Apply 1 Application topically in the morning and at bedtime. Apply to knees     pravastatin (PRAVACHOL) 10 MG tablet Take 10 mg by mouth daily.     traMADol (ULTRAM) 50 MG tablet Take 25 mg by mouth in the morning, at noon, and at bedtime.     traZODone (DESYREL) 50 MG tablet Take 50 mg by mouth at bedtime.     No current facility-administered medications for this visit.    Review of Systems  Constitutional: positive for fatigue Eyes: negative Ears, nose, mouth, throat, and face: negative Respiratory: negative Cardiovascular: negative Gastrointestinal: negative Genitourinary:negative Integument/breast: negative Hematologic/lymphatic: negative Musculoskeletal:negative Neurological: negative Behavioral/Psych: negative Endocrine: negative Allergic/Immunologic: negative  Physical Exam  KGU:RKYHC, healthy, no distress, well nourished, and well developed SKIN: skin color, texture, turgor are normal, no rashes or significant lesions HEAD: Normocephalic, No masses, lesions, tenderness or abnormalities EYES: normal, PERRLA, Conjunctiva are pink and non-injected EARS: External ears normal, Canals clear OROPHARYNX:no exudate, no erythema, and lips, buccal mucosa, and tongue normal  NECK: supple, no adenopathy, no JVD LYMPH:  no palpable lymphadenopathy, no hepatosplenomegaly BREAST:not examined LUNGS: clear to auscultation , and palpation HEART: regular rate & rhythm, no murmurs, and no gallops ABDOMEN:abdomen soft, non-tender, normal bowel sounds, and no masses or organomegaly BACK: Back symmetric, no curvature., No CVA tenderness EXTREMITIES:no joint deformities, effusion, or inflammation, no edema  NEURO: alert & oriented x 3 with  fluent speech, no focal motor/sensory deficits  PERFORMANCE STATUS: ECOG 1  LABORATORY DATA: Lab Results  Component Value Date   WBC 6.9 05/12/2022   HGB 12.7 05/12/2022   HCT 36.9 05/12/2022   MCV 84.8 05/12/2022   PLT 283 05/12/2022      Chemistry      Component Value Date/Time   NA 144 04/22/2022 1008   K 4.0 04/22/2022 1008   CL 107 04/22/2022 1008   CO2 29 04/22/2022 1008   BUN 16 04/22/2022 1008   CREATININE 0.88 04/22/2022 1008      Component Value Date/Time   CALCIUM 10.4 (H) 04/22/2022 1008   ALKPHOS 60 04/22/2022 1008   AST 18 04/22/2022 1008   ALT 12 04/22/2022 1008   BILITOT 0.6 04/22/2022 1008       RADIOGRAPHIC STUDIES: No results found.  ASSESSMENT: This is a a very pleasant 81 years old African-American female recently diagnosed with a stage II (T2, N0, M0) urothelial carcinoma of the bladder diagnosed in December 2023 status post TURP with resection of the tumor.  The patient is not a good surgical candidate  for bladder resection.    PLAN: I had a lengthy discussion with the patient and her daughter today about her current condition and treatment options. I recommended for the patient to complete the staging workup by ordering CT scan of the chest to rule out any metastatic disease in the chest.  She already had CT scan of the abdomen and pelvis that was unremarkable for metastatic disease. The patient is not a good surgical candidate for bladder resection. I recommended for her a course of concurrent chemoradiation.  The best option would be consideration of cisplatin as an radiosensitizing agent but with her chronic kidney disease and her age and performance status I recommended for her treatment with gemcitabine at a lower dose of 27 Mg/M2 on days 1, 4, 8, 11, 15, 18, 22 and 25 for the first cycle and similar treatment for the second cycle until day 18. The patient was seen by Dr. Tammi Klippel and he is planning for the radiotherapy. I will see her back for  follow-up visit with the start of her treatment. I will arrange for the patient to have a chemotherapy education class before the first dose of her treatment. She was advised to call immediately if she has any concerning symptoms in the interval.  The patient voices understanding of current disease status and treatment options and is in agreement with the current care plan.  All questions were answered. The patient knows to call the clinic with any problems, questions or concerns. We can certainly see the patient much sooner if necessary.  Thank you so much for allowing me to participate in the care of Traci Powell. I will continue to follow up the patient with you and assist in her care.  The total time spent in the appointment was 90 minutes.  Disclaimer: This note was dictated with voice recognition software. Similar sounding words can inadvertently be transcribed and may not be corrected upon review.   Eilleen Kempf May 12, 2022, 2:00 PM

## 2022-05-12 NOTE — Progress Notes (Signed)
START ON PATHWAY REGIMEN - Bladder   Gemcitabine 27 mg/m2 IV Twice Weekly + RT x 4 Weeks (Induction):   One induction cycle is 4 weeks:     Gemcitabine    Gemcitabine 27 mg/m2 IV Twice Weekly + RT x 2.5 Weeks (Consolidation):   One consolidation cycle is 2.5 weeks:     Gemcitabine   **Always confirm dose/schedule in your pharmacy ordering system**  Patient Characteristics: Pre-Cystectomy or Nonsurgical Candidate, M0 (Clinical Staging), cT2-4a, cN0-1, M0, Cystectomy Ineligible Therapeutic Status: Pre-Cystectomy or Nonsurgical Candidate, M0 (Clinical Staging) AJCC M Category: cM0 AJCC 8 Stage Grouping: II AJCC T Category: cT2 AJCC N Category: cN0 Intent of Therapy: Curative Intent, Discussed with Patient

## 2022-05-13 ENCOUNTER — Other Ambulatory Visit: Payer: Self-pay

## 2022-05-14 ENCOUNTER — Other Ambulatory Visit: Payer: Self-pay

## 2022-05-17 ENCOUNTER — Other Ambulatory Visit: Payer: Self-pay

## 2022-05-18 ENCOUNTER — Telehealth: Payer: Self-pay | Admitting: Internal Medicine

## 2022-05-18 NOTE — Telephone Encounter (Signed)
Scheduled per 12/27 los and work-queue, called patient regarding all upcoming appointments, left a voicemail.

## 2022-05-20 ENCOUNTER — Ambulatory Visit (INDEPENDENT_AMBULATORY_CARE_PROVIDER_SITE_OTHER): Payer: Medicare PPO

## 2022-05-20 ENCOUNTER — Telehealth: Payer: Self-pay | Admitting: Medical Oncology

## 2022-05-20 ENCOUNTER — Ambulatory Visit: Payer: Self-pay

## 2022-05-20 ENCOUNTER — Ambulatory Visit (INDEPENDENT_AMBULATORY_CARE_PROVIDER_SITE_OTHER): Payer: Medicare PPO | Admitting: Orthopaedic Surgery

## 2022-05-20 ENCOUNTER — Encounter: Payer: Self-pay | Admitting: Orthopaedic Surgery

## 2022-05-20 DIAGNOSIS — G8929 Other chronic pain: Secondary | ICD-10-CM | POA: Diagnosis not present

## 2022-05-20 DIAGNOSIS — M170 Bilateral primary osteoarthritis of knee: Secondary | ICD-10-CM

## 2022-05-20 DIAGNOSIS — M25561 Pain in right knee: Secondary | ICD-10-CM | POA: Diagnosis not present

## 2022-05-20 DIAGNOSIS — M25562 Pain in left knee: Secondary | ICD-10-CM

## 2022-05-20 NOTE — Telephone Encounter (Signed)
Needs all prescriptions to be faxed to [facility] -attention Darlene.

## 2022-05-20 NOTE — Progress Notes (Signed)
Office Visit Note   Patient: Traci Powell           Date of Birth: July 13, 1940           MRN: [id number] Visit Date: 05/20/2022              Requested by: No referring provider defined for this encounter. PCP: Pcp, No   Assessment & Plan: Visit Diagnoses:  1. Chronic pain of both knees   2. Bilateral primary osteoarthritis of knee     Plan: Impression is bilateral knee advanced degenerative joint disease.  Today, we will the patient does not appear to be in any pain and appears to be ambulating without any trouble so I do not recommend cortisone injection.  If she does develop pain or trouble ambulating, she will follow back up with [place] for injections.  Call with concerns or questions in the meantime.  Follow-Up Instructions: Return if symptoms worsen or fail to improve.   Orders:  Orders Placed This Encounter  Procedures   XR KNEE 3 VIEW LEFT   XR KNEE 3 VIEW RIGHT   No orders of the defined types were placed in this encounter.     Procedures: No procedures performed   Clinical Data: No additional findings.   Subjective: Chief Complaint  Patient presents with   Right Knee - Pain   Left Knee - Pain    HPI patient is a pleasant 82 year old pleasantly demented female who resides at [facility] who comes in today for bilateral knee pain.  The caretaker that is here with her today's unsure why she is here.  Patient is not complaining of pain herself and the caretaker notes that she is ambulating fine at [facility] and does not appear to be in any pain.  It sounds as though the patient's daughter felt that the patient needed to be seen.  She is not here today.  Review of Systems as detailed in HPI.  All others reviewed and are negative.   Objective: Vital Signs: There were no vitals taken for this visit.  Physical Exam well-developed well-nourished female in no acute distress.  Alert and oriented x 3.  Ortho Exam bilateral knee exam shows no effusion.  Range  of motion 0 to 120 degrees.  No joint line tenderness.  Moderate patellofemoral crepitus.  She is neurovascular intact distally.  Specialty Comments:  No specialty comments available.  Imaging: XR KNEE 3 VIEW RIGHT  Result Date: 05/20/2022 X-rays demonstrate advanced degenerative changes to the lateral patellofemoral compartments with moderate changes to the medial compartment  XR KNEE 3 VIEW LEFT  Result Date: 05/20/2022 X-rays demonstrate advanced degenerative changes to the lateral patellofemoral compartments with moderate changes to the medial compartment    PMFS History: Patient Active Problem List   Diagnosis Date Noted   Cancer of overlapping sites of bladder ([place]) 04/28/2022   Vitamin B 12 deficiency 01/27/2016   Encounter for antineoplastic chemotherapy 01/27/2016   UTI (urinary tract infection) 01/26/2016   Syncope 01/26/2016   HTN (hypertension) 01/26/2016   HLD (hyperlipidemia) 01/26/2016   Gastroesophageal reflux disease without esophagitis 01/26/2016   Alzheimer's dementia with behavioral disturbance ([place]) 01/26/2016   Past Medical History:  Diagnosis Date   Alzheimer's dementia ([place])    Cervical stenosis of spine    CKD (chronic kidney disease)    Gait disorder    Insomnia    Personal history of COVID-19 08/2020    No family history on file.  Past Surgical  History:  Procedure Laterality Date   ABDOMINAL HYSTERECTOMY     COLON SURGERY  2007   resection W/ ileostomy and reversal   TRANSURETHRAL RESECTION OF BLADDER TUMOR WITH MITOMYCIN-C N/A 04/22/2022   Procedure: TRANSURETHRAL RESECTION OF BLADDER TUMOR;  Surgeon: Irine Seal, MD;  Location: [facility];  Service: Urology;  Laterality: N/A;   Social History   Occupational History   Not on file  Tobacco Use   Smoking status: Former    Packs/day: 0.25    Years: 5.00    Total pack years: 1.25    Types: Cigarettes    Quit date: 04/20/1998    Years since quitting: 24.0   Smokeless tobacco: Never  Substance  and Sexual Activity   Alcohol use: Never   Drug use: Never   Sexual activity: Not on file

## 2022-05-20 NOTE — Progress Notes (Signed)
Pharmacist Chemotherapy Monitoring - Initial Assessment    Anticipated start date: 05/27/22   The following has been reviewed per standard work regarding the patient's treatment regimen: The patient's diagnosis, treatment plan and drug doses, and organ/hematologic function Lab orders and baseline tests specific to treatment regimen  The treatment plan start date, drug sequencing, and pre-medications Prior authorization status  Patient's documented medication list, including drug-drug interaction screen and prescriptions for anti-emetics and supportive care specific to the treatment regimen The drug concentrations, fluid compatibility, administration routes, and timing of the medications to be used The patient's access for treatment and lifetime cumulative dose history, if applicable  The patient's medication allergies and previous infusion related reactions, if applicable   Changes made to treatment plan:  N/A  Follow up needed:  N/A   Traci Powell, [place], 05/20/2022  12:51 PM

## 2022-05-21 ENCOUNTER — Inpatient Hospital Stay: Payer: Medicare PPO

## 2022-05-21 ENCOUNTER — Telehealth: Payer: Self-pay

## 2022-05-21 ENCOUNTER — Other Ambulatory Visit: Payer: Self-pay

## 2022-05-21 ENCOUNTER — Ambulatory Visit (HOSPITAL_COMMUNITY)
Admission: RE | Admit: 2022-05-21 | Discharge: 2022-05-21 | Disposition: A | Discharge status: Home / Self Care | Payer: Medicare PPO | Source: Ambulatory Visit | Attending: Internal Medicine | Admitting: Internal Medicine

## 2022-05-21 DIAGNOSIS — Z87891 Personal history of nicotine dependence: Secondary | ICD-10-CM | POA: Insufficient documentation

## 2022-05-21 DIAGNOSIS — Z90710 Acquired absence of both cervix and uterus: Secondary | ICD-10-CM | POA: Insufficient documentation

## 2022-05-21 DIAGNOSIS — Z5111 Encounter for antineoplastic chemotherapy: Secondary | ICD-10-CM | POA: Insufficient documentation

## 2022-05-21 DIAGNOSIS — C678 Malignant neoplasm of overlapping sites of bladder: Secondary | ICD-10-CM | POA: Insufficient documentation

## 2022-05-21 DIAGNOSIS — Z79899 Other long term (current) drug therapy: Secondary | ICD-10-CM | POA: Insufficient documentation

## 2022-05-21 DIAGNOSIS — Z9079 Acquired absence of other genital organ(s): Secondary | ICD-10-CM | POA: Insufficient documentation

## 2022-05-21 DIAGNOSIS — I10 Essential (primary) hypertension: Secondary | ICD-10-CM | POA: Insufficient documentation

## 2022-05-21 NOTE — Telephone Encounter (Signed)
This nurse received a call from [facility] and [place] and was made aware that the facility formulary does not provide Compazine.  Would like to know if nausea medication could be changed to Zofran.  This nurse spoke with provider who gave a verbal order for Zofran 8 mg every 8 hours as needed.  The nurse acknowledged and stated that the orders will be updated.  No further questions or concerns at this time.

## 2022-05-21 NOTE — Telephone Encounter (Signed)
Faxed signed Compazine prescription to [facility]

## 2022-05-21 NOTE — Telephone Encounter (Signed)
Per request of chemo education nurse, this RN reached out to patient's daughter to verify that family would like radiation therapy. Daughter stated  that they would like to proceed with rad therapy.  Routed to Dr. Tammi Klippel and [facility] Clines.

## 2022-05-21 NOTE — Addendum Note (Signed)
Addended by: Estella Husk on: 05/21/2022 03:20 PM   Modules accepted: Orders

## 2022-05-21 NOTE — Telephone Encounter (Signed)
Correction:  Gerardine Peltz (daughter) called in reference to Traci Powell (not Tamala Julian) who was referred from Dr. 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 Consulted on 04/28/2022 with radiation.  After thinking it over said that they were given two options for radiation treatment by Dr. Tammi Klippel.  1.  Radiotherapy alone either delivered over the course of 36 daily treatments or option  2.  Split course of radiation with 2 weeks of daily treatments followed by a 3-week break and pending the radiation treatments are well-tolerated, we would then resume an additional 2 to 3-week course of daily bladder radiation.  She has chosen to go with the second option.  Notified Ashlyn Bruning, PA-C of decision.

## 2022-05-21 NOTE — Telephone Encounter (Signed)
Traci Powell (daughter) called  Traci Powell was referred from Dr. 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 Consulted on 04/28/2022 with radiation.  After thinking it over said that they were given two options for radiation treatment by Dr. Tammi Klippel.  1.  Radiotherapy alone either delivered over the course of 36 daily treatments or option  2.  Split course of radiation with 2 weeks of daily treatments followed by a 3-week break and pending the radiation treatments are well-tolerated, we would then resume an additional 2 to 3-week course of daily bladder radiation.  She has chosen to go with the second option.  Notified Ashlyn Bruning, PA-C of decision.

## 2022-05-24 ENCOUNTER — Inpatient Hospital Stay: Payer: Medicare PPO

## 2022-05-24 ENCOUNTER — Other Ambulatory Visit: Payer: Self-pay

## 2022-05-24 ENCOUNTER — Inpatient Hospital Stay: Payer: Medicare PPO | Admitting: Internal Medicine

## 2022-05-24 ENCOUNTER — Ambulatory Visit
Admission: RE | Admit: 2022-05-24 | Discharge: 2022-05-24 | Disposition: A | Discharge status: Home / Self Care | Payer: Medicare PPO | Source: Ambulatory Visit | Attending: Radiation Oncology | Admitting: Radiation Oncology

## 2022-05-24 VITALS — BP 166/84 | HR 82 | Resp 16

## 2022-05-24 DIAGNOSIS — F02818 Dementia in other diseases classified elsewhere, unspecified severity, with other behavioral disturbance: Secondary | ICD-10-CM | POA: Insufficient documentation

## 2022-05-24 DIAGNOSIS — C678 Malignant neoplasm of overlapping sites of bladder: Secondary | ICD-10-CM | POA: Diagnosis not present

## 2022-05-24 DIAGNOSIS — Z5111 Encounter for antineoplastic chemotherapy: Secondary | ICD-10-CM | POA: Diagnosis not present

## 2022-05-24 DIAGNOSIS — Z87891 Personal history of nicotine dependence: Secondary | ICD-10-CM | POA: Diagnosis not present

## 2022-05-24 DIAGNOSIS — Z9079 Acquired absence of other genital organ(s): Secondary | ICD-10-CM | POA: Diagnosis not present

## 2022-05-24 DIAGNOSIS — I129 Hypertensive chronic kidney disease with stage 1 through stage 4 chronic kidney disease, or unspecified chronic kidney disease: Secondary | ICD-10-CM | POA: Diagnosis not present

## 2022-05-24 DIAGNOSIS — Z8616 Personal history of COVID-19: Secondary | ICD-10-CM | POA: Diagnosis not present

## 2022-05-24 DIAGNOSIS — Z510 Encounter for antineoplastic radiation therapy: Secondary | ICD-10-CM | POA: Diagnosis not present

## 2022-05-24 LAB — CBC WITH DIFFERENTIAL (CANCER CENTER ONLY)
Abs Immature Granulocytes: 0.03 10*3/uL (ref 0.00–0.07)
Basophils Absolute: 0 10*3/uL (ref 0.0–0.1)
Basophils Relative: 0 %
Eosinophils Absolute: 0.1 10*3/uL (ref 0.0–0.5)
Eosinophils Relative: 2 %
HCT: 37 % (ref 36.0–46.0)
Hemoglobin: 12.8 g/dL (ref 12.0–15.0)
Immature Granulocytes: 1 %
Lymphocytes Relative: 37 %
Lymphs Abs: 2.3 10*3/uL (ref 0.7–4.0)
MCH: 29 pg (ref 26.0–34.0)
MCHC: 34.6 g/dL (ref 30.0–36.0)
MCV: 83.7 fL (ref 80.0–100.0)
Monocytes Absolute: 0.7 10*3/uL (ref 0.1–1.0)
Monocytes Relative: 12 %
Neutro Abs: 3 10*3/uL (ref 1.7–7.7)
Neutrophils Relative %: 48 %
Platelet Count: 258 10*3/uL (ref 150–400)
RBC: 4.42 MIL/uL (ref 3.87–5.11)
RDW: 16.7 % — ABNORMAL HIGH (ref 11.5–15.5)
WBC Count: 6.2 10*3/uL (ref 4.0–10.5)
nRBC: 0 % (ref 0.0–0.2)

## 2022-05-24 LAB — CMP (CANCER CENTER ONLY)
ALT: 10 U/L (ref 0–44)
AST: 17 U/L (ref 15–41)
Albumin: 4.1 g/dL (ref 3.5–5.0)
Alkaline Phosphatase: 70 U/L (ref 38–126)
Anion gap: 5 (ref 5–15)
BUN: 17 mg/dL (ref 8–23)
CO2: 29 mmol/L (ref 22–32)
Calcium: 10.3 mg/dL (ref 8.9–10.3)
Chloride: 108 mmol/L (ref 98–111)
Creatinine: 0.84 mg/dL (ref 0.44–1.00)
GFR, Estimated: >60 mL/min (ref >60)
Glucose, Bld: 67 mg/dL — ABNORMAL LOW (ref 70–99)
Potassium: 4.2 mmol/L (ref 3.5–5.1)
Sodium: 142 mmol/L (ref 135–145)
Total Bilirubin: 0.4 mg/dL (ref 0.3–1.2)
Total Protein: 6.8 g/dL (ref 6.5–8.1)

## 2022-05-24 MED ORDER — SODIUM CHLORIDE 0.9 % IV SOLN
27.0000 mg/m2 | Freq: Once | INTRAVENOUS | Status: AC
  Administered 2022-05-24: 46 mg via INTRAVENOUS
  Filled 2022-05-24: qty 1.21

## 2022-05-24 MED ORDER — SODIUM CHLORIDE 0.9 % IV SOLN: Freq: Once | INTRAVENOUS | Status: AC

## 2022-05-24 MED ORDER — PROCHLORPERAZINE MALEATE 10 MG PO TABS
10.0000 mg | ORAL_TABLET | Freq: Once | ORAL | Status: AC
  Administered 2022-05-24: 10 mg via ORAL
  Filled 2022-05-24: qty 1

## 2022-05-24 NOTE — Progress Notes (Signed)
  Radiation Oncology         [phone] ________________________________  Name: Reika Callanan MRN: [id number]  Date: 05/24/2022  DOB: Aug 05, 1940  SIMULATION AND TREATMENT PLANNING NOTE    ICD-10-CM   1. Cancer of overlapping sites of bladder [facility])  C67.8       DIAGNOSIS:  82 year old female with muscle invasive bladder cancer presenting with gross hematuria.  NARRATIVE:  The patient was brought to the [facility].  Identity was confirmed.  All relevant records and images related to the planned course of therapy were reviewed.  The patient freely provided informed written consent to proceed with treatment after reviewing the details related to the planned course of therapy. The consent form was witnessed and verified by the simulation staff.  Then, the patient was set-up in a stable reproducible  supine position for radiation therapy.  Contrast was instilled into the bladder with a catheter under sterile conditions.  CT images were obtained.  Surface markings were placed.  The CT images were loaded into the planning software.  Then the target and avoidance structures were contoured.  Treatment planning then occurred.  The radiation prescription was entered and confirmed.  Then, I designed and supervised the construction of a total of 5 medically necessary complex treatment devices including VacLoc body positioner and 4 MLCs to shield the bowel and femoral necks.  I have requested : 3D Simulation  I have requested a DVH of the following structures: small bowel, rectum, left femoral head, right femoral head and targets.  SPECIAL TREATMENT PROCEDURE:  The planned course of therapy using radiation constitutes a special treatment procedure. Special care is required in the management of this patient for the following reasons. This treatment constitutes a Special Treatment Procedure for the following reason: [ Concurrent chemotherapy requiring careful monitoring for increased toxicities  of treatment including weekly laboratory values..  The special nature of the planned course of radiotherapy will require increased physician supervision and oversight to ensure patient's safety with optimal treatment outcomes.  PLAN:  The patient will receive 19.8 Gy in 11 fractions of 1.8 Gy to the bladder tumor with full bladder, then 45 Gy in 25 fractions to the whole bladder and pelvic nodes to a total dose of 64.8 Gy.  ________________________________  Sheral Apley Tammi Klippel, M.D.

## 2022-05-24 NOTE — Patient Instructions (Signed)
Traci Powell ONCOLOGY  Discharge Instructions: Thank you for choosing [facility] to provide your oncology and hematology care.   If you have a lab appointment with the [facility], please go directly to the [facility] and check in at the registration area.   Wear comfortable clothing and clothing appropriate for easy access to any Portacath or PICC line.   We strive to give you quality time with your provider. You may need to reschedule your appointment if you arrive late (15 or more minutes).  Arriving late affects you and other patients whose appointments are after yours.  Also, if you miss three or more appointments without notifying the office, you may be dismissed from the clinic at the provider's discretion.      For prescription refill requests, have your pharmacy contact our office and allow 72 hours for refills to be completed.    Today you received the following chemotherapy and/or immunotherapy agents: gemcitabine      To help prevent nausea and vomiting after your treatment, we encourage you to take your nausea medication as directed.  BELOW ARE SYMPTOMS THAT SHOULD BE REPORTED IMMEDIATELY: *FEVER GREATER THAN 100.4 F (38 C) OR HIGHER *CHILLS OR SWEATING *NAUSEA AND VOMITING THAT IS NOT CONTROLLED WITH YOUR NAUSEA MEDICATION *UNUSUAL SHORTNESS OF BREATH *UNUSUAL BRUISING OR BLEEDING *URINARY PROBLEMS (pain or burning when urinating, or frequent urination) *BOWEL PROBLEMS (unusual diarrhea, constipation, pain near the anus) TENDERNESS IN MOUTH AND THROAT WITH OR WITHOUT PRESENCE OF ULCERS (sore throat, sores in mouth, or a toothache) UNUSUAL RASH, SWELLING OR PAIN  UNUSUAL VAGINAL DISCHARGE OR ITCHING   Items with * indicate a potential emergency and should be followed up as soon as possible or go to the Emergency Department if any problems should occur.  Please show the CHEMOTHERAPY ALERT CARD or IMMUNOTHERAPY ALERT CARD at check-in  to the Emergency Department and triage nurse.  Should you have questions after your visit or need to cancel or reschedule your appointment, please contact [facility]  Dept: [phone]  and follow the prompts.  Office hours are 8:00 a.m. to 4:30 p.m. Monday - Friday. Please note that voicemails left after 4:00 p.m. may not be returned until the following business day.  We are closed weekends and major holidays. You have access to a nurse at all times for urgent questions. Please call the main number to the clinic Dept: [phone] and follow the prompts.   For any non-urgent questions, you may also contact your provider using MyChart. We now offer e-Visits for anyone 60 and older to request care online for non-urgent symptoms. For details visit [email or web address].   Also download the MyChart app! Go to the app store, search "MyChart", open the app, select [facility], and log in with your MyChart username and password.

## 2022-05-24 NOTE — Progress Notes (Signed)
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 The patient is not a good surgical candidate for bladder resection.   PRIOR THERAPY: None  CURRENT THERAPY: Current chemoradiation with gemcitabine at a lower dose of 27 Mg/M2 on days 1, 4, 8, 11, 15, 18, 22 and 25 for the first cycle and similar treatment for the second cycle until day 18.  First dose January 12/2022.  INTERVAL HISTORY: Traci Powell 82 y.o. female returns to the clinic today for follow-up visit.  She was accompanied by her caregiver.  She denied having any complaints today.  She has no nausea, vomiting, diarrhea or constipation.  She has no chest pain, shortness of breath, cough or hemoptysis.  She has no dysuria or hematuria.  She has no recent weight loss or night sweats.  She is here today for evaluation before starting the first dose of her concurrent chemoradiation with gemcitabine.  She had CT scan of the chest performed recently that showed 0.6 cm nodule along the course of the minor fissure that require close monitoring.  MEDICAL HISTORY: Past Medical History:  Diagnosis Date   Alzheimer's dementia [facility])    Cervical stenosis of spine    CKD (chronic kidney disease)    Gait disorder    Insomnia    Personal history of COVID-19 08/2020    ALLERGIES:  is allergic to iodinated contrast media.  MEDICATIONS:  Current Outpatient Medications  Medication Sig Dispense Refill   acetaminophen (TYLENOL) 500 MG tablet Take 1,000 mg by mouth in the morning and at bedtime.     amLODipine (NORVASC) 2.5 MG tablet Take 2.5 mg by mouth daily.     diclofenac Sodium (VOLTAREN) 1 % GEL Apply 3 g topically in the morning and at bedtime.     divalproex (DEPAKOTE ER) 250 MG 24 hr tablet Take 250 mg by mouth 2  (two) times daily.     docusate sodium (COLACE) 100 MG capsule Take 100 mg by mouth at bedtime.     lisinopril (ZESTRIL) 20 MG tablet Take 20 mg by mouth daily.     melatonin 3 MG TABS tablet Take 10 mg by mouth at bedtime.     memantine (NAMENDA) 10 MG tablet Take 10 mg by mouth 2 (two) times daily.     Menthol, Topical Analgesic, (BIOFREEZE) 10 % CREA Apply 1 Application topically in the morning and at bedtime. Apply to knees     ondansetron (ZOFRAN) 8 MG tablet Take 8 mg by mouth every 8 (eight) hours as needed for nausea or vomiting.     pravastatin (PRAVACHOL) 10 MG tablet Take 10 mg by mouth daily.     traMADol (ULTRAM) 50 MG tablet Take 25 mg by mouth 4 (four) times daily.     traZODone (DESYREL) 50 MG tablet Take 50 mg by mouth at bedtime.     No current facility-administered medications for this visit.    SURGICAL HISTORY:  Past Surgical History:  Procedure Laterality Date   ABDOMINAL HYSTERECTOMY     COLON SURGERY  2007   resection W/ ileostomy and reversal   TRANSURETHRAL RESECTION OF BLADDER TUMOR WITH MITOMYCIN-C N/A 04/22/2022   Procedure: TRANSURETHRAL RESECTION OF BLADDER TUMOR;  Surgeon: Irine Seal, MD;  Location: [facility];  Service:  Urology;  Laterality: N/A;    REVIEW OF SYSTEMS:  A comprehensive review of systems was negative except for: Constitutional: positive for fatigue   PHYSICAL EXAMINATION: General appearance: alert, cooperative, fatigued, and no distress Head: Normocephalic, without obvious abnormality, atraumatic Neck: no adenopathy, no JVD, supple, symmetrical, trachea midline, and thyroid not enlarged, symmetric, no tenderness/mass/nodules Lymph nodes: Cervical, supraclavicular, and axillary nodes normal. Resp: clear to auscultation bilaterally Back: symmetric, no curvature. ROM normal. No CVA tenderness. Cardio: regular rate and rhythm, S1, S2 normal, no murmur, click, rub or gallop GI: soft, non-tender; bowel sounds normal; no masses,  no  organomegaly Extremities: extremities normal, atraumatic, no cyanosis or edema  ECOG PERFORMANCE STATUS: 1 - Symptomatic but completely ambulatory  Blood pressure (!) 167/67, pulse 71, temperature 98.6 F (37 C), temperature source Oral, resp. rate 16, weight 149 lb 8 oz (67.8 kg), SpO2 99 %.  LABORATORY DATA: Lab Results  Component Value Date   WBC 6.2 05/24/2022   HGB 12.8 05/24/2022   HCT 37.0 05/24/2022   MCV 83.7 05/24/2022   PLT 258 05/24/2022      Chemistry      Component Value Date/Time   NA 145 05/12/2022 1348   K 4.4 05/12/2022 1348   CL 107 05/12/2022 1348   CO2 31 05/12/2022 1348   BUN 20 05/12/2022 1348   CREATININE 0.85 05/12/2022 1348      Component Value Date/Time   CALCIUM 10.5 (H) 05/12/2022 1348   ALKPHOS 68 05/12/2022 1348   AST 15 05/12/2022 1348   ALT 13 05/12/2022 1348   BILITOT 0.3 05/12/2022 1348       RADIOGRAPHIC STUDIES: CT Chest Wo Contrast  Result Date: 05/22/2022 CLINICAL DATA:  Staging bladder cancer. EXAM: CT CHEST WITHOUT CONTRAST TECHNIQUE: Multidetector CT imaging of the chest was performed following the standard protocol without IV contrast. RADIATION DOSE REDUCTION: This exam was performed according to the departmental dose-optimization program which includes automated exposure control, adjustment of the mA and/or kV according to patient size and/or use of iterative reconstruction technique. COMPARISON:  CT urogram 03/30/2022. Multiple other comparison examination were appropriate FINDINGS: Cardiovascular: The heart is nonenlarged. Trace pericardial fluid. Grossly normal caliber thoracic aorta on this noncontrast examination. Scattered atherosclerotic calcifications are seen along the aorta and branch vessels including the coronary arteries. Mediastinum/Nodes: Slightly lobular appearance to the otherwise atrophic thyroid gland on the right with focal posterior nodule measuring 12 mm. This is stable going back to a cervical spine CT of  08/19/2020. No follow-up imaging is recommended at this time. On this non IV contrast exam there is no specific abnormal lymph node enlargement seen in the axillary region, hilum or mediastinum. Normal caliber thoracic esophagus. Lungs/Pleura: There is linear opacity lung bases likely scar or atelectasis. There is also some scattered breathing motion. No consolidation, pneumothorax or effusion. There is a 6 x 3 mm nodular area seen along the course of the minor fissure on series 5, image 103. No other dominant lung nodule. Upper Abdomen: Adrenal glands in the upper abdomen are grossly preserved. Musculoskeletal: Curvature of the spine with some scattered degenerative changes. Motion. IMPRESSION: 6 mm nodule along the course of the minor fissure. Please correlate with any prior otherwise additional follow-up surveillance. Non-contrast chest CT at 6-12 months is recommended. If the nodule is stable at time of repeat CT, then future CT at 18-24 months (from today's scan) is considered optional for low-risk patients, but is recommended for high-risk patients. This recommendation follows the consensus statement:  Guidelines for Management of Incidental Pulmonary Nodules Detected on CT Images: From the Fleischner Society 2017; Radiology 2017; 284:228-243. Aortic Atherosclerosis (ICD10-I70.0). Electronically Signed   By: Jill Side M.D.   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 The patient is not a good surgical candidate for bladder resection.  She is currently undergoing a course of concurrent chemoradiation with gemcitabine at a lower dose of 27 Mg/M2 on days 1, 4, 8, 11, 15, 18, 22 and 25 for the first cycle and similar treatment for the second cycle until day 18.  First dose January 12/2022. She is feeling fine today and I recommended for her to proceed with the first cycle of her treatment as planned.  She is also expected to start her concurrent radiotherapy this week. I will see her back for follow-up visit in 2 weeks for evaluation and management of any adverse effect of her treatment. For the hypertension, she was advised to monitor her blood pressure closely at home and to check with her primary care physician for adjustment of her medications. The patient was advised to call immediately if she has any other concerning symptoms in the interval. The patient voices understanding of current disease status and treatment options and is in agreement with the current care plan.  All questions were answered. The patient knows to call the clinic with any problems, questions or concerns. We can certainly see the patient much sooner if necessary.  The total time spent in the appointment was 20 minutes.  Disclaimer: This note was dictated with voice recognition software. Similar sounding words can inadvertently be transcribed and may not be corrected upon review.

## 2022-05-25 ENCOUNTER — Other Ambulatory Visit: Payer: Self-pay

## 2022-05-25 ENCOUNTER — Telehealth: Payer: Self-pay | Admitting: *Deleted

## 2022-05-25 NOTE — Telephone Encounter (Signed)
Called pt's daughter, Brayton Layman to see how mother did with treatment.  LVMTCB.

## 2022-05-25 NOTE — Telephone Encounter (Signed)
-----   Message from Traci Hurdle, RN sent at 05/24/2022 11:14 AM EST ----- Regarding: 1st Time Gemzar- Dr Julien Nordmann First Time PIV Gemzar. Dr Worthy Flank patient. Patient has dementia and lives in a facility. Daughter is HCPOA. Please follow up with her.  No issues during treatment.

## 2022-05-27 ENCOUNTER — Inpatient Hospital Stay: Payer: Medicare PPO

## 2022-05-27 ENCOUNTER — Other Ambulatory Visit: Payer: Self-pay

## 2022-05-27 VITALS — BP 178/76 | HR 78 | Temp 98.2°F | Resp 18

## 2022-05-27 DIAGNOSIS — C678 Malignant neoplasm of overlapping sites of bladder: Secondary | ICD-10-CM

## 2022-05-27 DIAGNOSIS — Z5111 Encounter for antineoplastic chemotherapy: Secondary | ICD-10-CM | POA: Diagnosis not present

## 2022-05-27 MED ORDER — SODIUM CHLORIDE 0.9 % IV SOLN: Freq: Once | INTRAVENOUS | Status: AC

## 2022-05-27 MED ORDER — PROCHLORPERAZINE MALEATE 10 MG PO TABS
10.0000 mg | ORAL_TABLET | Freq: Once | ORAL | Status: AC
  Administered 2022-05-27: 10 mg via ORAL
  Filled 2022-05-27: qty 1

## 2022-05-27 MED ORDER — SODIUM CHLORIDE 0.9 % IV SOLN
27.0000 mg/m2 | Freq: Once | INTRAVENOUS | Status: AC
  Administered 2022-05-27: 46 mg via INTRAVENOUS
  Filled 2022-05-27: qty 1.21

## 2022-05-27 NOTE — Patient Instructions (Signed)
[facility]  Discharge Instructions: Thank you for choosing [facility] to provide your oncology and hematology care.   If you have a lab appointment with the [facility], please go directly to the [facility] and check in at the registration area.   Wear comfortable clothing and clothing appropriate for easy access to any Portacath or PICC line.   We strive to give you quality time with your provider. You may need to reschedule your appointment if you arrive late (15 or more minutes).  Arriving late affects you and other patients whose appointments are after yours.  Also, if you miss three or more appointments without notifying the office, you may be dismissed from the clinic at the provider's discretion.      For prescription refill requests, have your pharmacy contact our office and allow 72 hours for refills to be completed.    Today you received the following chemotherapy and/or immunotherapy agents: gemcitabine      To help prevent nausea and vomiting after your treatment, we encourage you to take your nausea medication as directed.  BELOW ARE SYMPTOMS THAT SHOULD BE REPORTED IMMEDIATELY: *FEVER GREATER THAN 100.4 F (38 C) OR HIGHER *CHILLS OR SWEATING *NAUSEA AND VOMITING THAT IS NOT CONTROLLED WITH YOUR NAUSEA MEDICATION *UNUSUAL SHORTNESS OF BREATH *UNUSUAL BRUISING OR BLEEDING *URINARY PROBLEMS (pain or burning when urinating, or frequent urination) *BOWEL PROBLEMS (unusual diarrhea, constipation, pain near the anus) TENDERNESS IN MOUTH AND THROAT WITH OR WITHOUT PRESENCE OF ULCERS (sore throat, sores in mouth, or a toothache) UNUSUAL RASH, SWELLING OR PAIN  UNUSUAL VAGINAL DISCHARGE OR ITCHING   Items with * indicate a potential emergency and should be followed up as soon as possible or go to the Emergency Department if any problems should occur.  Please show the CHEMOTHERAPY ALERT CARD or IMMUNOTHERAPY ALERT CARD at check-in  to the Emergency Department and triage nurse.  Should you have questions after your visit or need to cancel or reschedule your appointment, please contact [facility]  Dept: [phone]  and follow the prompts.  Office hours are 8:00 a.m. to 4:30 p.m. Monday - Friday. Please note that voicemails left after 4:00 p.m. may not be returned until the following business day.  We are closed weekends and major holidays. You have access to a nurse at all times for urgent questions. Please call the main number to the clinic Dept: [phone] and follow the prompts.   For any non-urgent questions, you may also contact your provider using MyChart. We now offer e-Visits for anyone 57 and older to request care online for non-urgent symptoms. For details visit [email or web address].   Also download the MyChart app! Go to the app store, search "MyChart", open the app, select [facility], and log in with your MyChart username and password.

## 2022-05-31 ENCOUNTER — Other Ambulatory Visit: Payer: Self-pay

## 2022-05-31 ENCOUNTER — Inpatient Hospital Stay: Payer: Medicare PPO

## 2022-05-31 ENCOUNTER — Ambulatory Visit
Admission: RE | Admit: 2022-05-31 | Discharge: 2022-05-31 | Disposition: A | Discharge status: Home / Self Care | Payer: Medicare PPO | Source: Ambulatory Visit | Attending: Radiation Oncology | Admitting: Radiation Oncology

## 2022-05-31 ENCOUNTER — Ambulatory Visit: Payer: Medicare PPO | Admitting: Internal Medicine

## 2022-05-31 ENCOUNTER — Other Ambulatory Visit: Payer: Self-pay | Admitting: Medical Oncology

## 2022-05-31 VITALS — BP 136/71 | HR 74 | Temp 98.3°F | Resp 18 | Wt 147.8 lb

## 2022-05-31 DIAGNOSIS — Z5111 Encounter for antineoplastic chemotherapy: Secondary | ICD-10-CM | POA: Diagnosis not present

## 2022-05-31 DIAGNOSIS — C678 Malignant neoplasm of overlapping sites of bladder: Secondary | ICD-10-CM

## 2022-05-31 DIAGNOSIS — I10 Essential (primary) hypertension: Secondary | ICD-10-CM

## 2022-05-31 LAB — CBC WITH DIFFERENTIAL (CANCER CENTER ONLY)
Abs Immature Granulocytes: 0.03 10*3/uL (ref 0.00–0.07)
Basophils Absolute: 0 10*3/uL (ref 0.0–0.1)
Basophils Relative: 0 %
Eosinophils Absolute: 0.1 10*3/uL (ref 0.0–0.5)
Eosinophils Relative: 1 %
HCT: 34.9 % — ABNORMAL LOW (ref 36.0–46.0)
Hemoglobin: 12.1 g/dL (ref 12.0–15.0)
Immature Granulocytes: 1 %
Lymphocytes Relative: 37 %
Lymphs Abs: 2.1 10*3/uL (ref 0.7–4.0)
MCH: 29.4 pg (ref 26.0–34.0)
MCHC: 34.7 g/dL (ref 30.0–36.0)
MCV: 84.7 fL (ref 80.0–100.0)
Monocytes Absolute: 0.4 10*3/uL (ref 0.1–1.0)
Monocytes Relative: 7 %
Neutro Abs: 3 10*3/uL (ref 1.7–7.7)
Neutrophils Relative %: 54 %
Platelet Count: 257 10*3/uL (ref 150–400)
RBC: 4.12 MIL/uL (ref 3.87–5.11)
RDW: 17 % — ABNORMAL HIGH (ref 11.5–15.5)
WBC Count: 5.5 10*3/uL (ref 4.0–10.5)
nRBC: 0.9 % — ABNORMAL HIGH (ref 0.0–0.2)

## 2022-05-31 LAB — CMP (CANCER CENTER ONLY)
ALT: 16 U/L (ref 0–44)
AST: 15 U/L (ref 15–41)
Albumin: 3.8 g/dL (ref 3.5–5.0)
Alkaline Phosphatase: 69 U/L (ref 38–126)
Anion gap: 4 — ABNORMAL LOW (ref 5–15)
BUN: 17 mg/dL (ref 8–23)
CO2: 32 mmol/L (ref 22–32)
Calcium: 10.4 mg/dL — ABNORMAL HIGH (ref 8.9–10.3)
Chloride: 107 mmol/L (ref 98–111)
Creatinine: 0.77 mg/dL (ref 0.44–1.00)
GFR, Estimated: >60 mL/min (ref >60)
Glucose, Bld: 73 mg/dL (ref 70–99)
Potassium: 4.3 mmol/L (ref 3.5–5.1)
Sodium: 143 mmol/L (ref 135–145)
Total Bilirubin: 0.3 mg/dL (ref 0.3–1.2)
Total Protein: 7 g/dL (ref 6.5–8.1)

## 2022-05-31 LAB — RAD ONC ARIA SESSION SUMMARY
Course Elapsed Days: 0
Plan Fractions Treated to Date: 1
Plan Prescribed Dose Per Fraction: 1.8 Gy
Plan Total Fractions Prescribed: 11
Plan Total Prescribed Dose: 19.8 Gy
Reference Point Dosage Given to Date: 1.8 Gy
Reference Point Session Dosage Given: 1.8 Gy
Session Number: 1

## 2022-05-31 MED ORDER — SODIUM CHLORIDE 0.9 % IV SOLN
27.0000 mg/m2 | Freq: Once | INTRAVENOUS | Status: AC
  Administered 2022-05-31: 46 mg via INTRAVENOUS
  Filled 2022-05-31: qty 1.21

## 2022-05-31 MED ORDER — CLONIDINE HCL 0.1 MG PO TABS
0.2000 mg | ORAL_TABLET | ORAL | Status: AC
  Administered 2022-05-31: 0.2 mg via ORAL
  Filled 2022-05-31: qty 2

## 2022-05-31 MED ORDER — SODIUM CHLORIDE 0.9 % IV SOLN: Freq: Once | INTRAVENOUS | Status: AC

## 2022-05-31 MED ORDER — PROCHLORPERAZINE MALEATE 10 MG PO TABS
10.0000 mg | ORAL_TABLET | Freq: Once | ORAL | Status: AC
  Administered 2022-05-31: 10 mg via ORAL
  Filled 2022-05-31: qty 1

## 2022-05-31 NOTE — Patient Instructions (Signed)
[place] [facility]COL[facility]  Discharge Instructions: Thank you for choosing [facility] to provide your oncology and hematology care.   If you have a lab appointment with the [facility], please go directly to the [facility] and check in at the registration area.   Wear comfortable clothing and clothing appropriate for easy access to any Portacath or PICC line.   We strive to give you quality time with your provider. You may need to reschedule your appointment if you arrive late (15 or more minutes).  Arriving late affects you and other patients whose appointments are after yours.  Also, if you miss three or more appointments without notifying the office, you may be dismissed from the clinic at the provider's discretion.      For prescription refill requests, have your pharmacy contact our office and allow 72 hours for refills to be completed.    Today you received the following chemotherapy and/or immunotherapy agents: gemcitabine      To help prevent nausea and vomiting after your treatment, we encourage you to take your nausea medication as directed.  BELOW ARE SYMPTOMS THAT SHOULD BE REPORTED IMMEDIATELY: *FEVER GREATER THAN 100.4 F (38 C) OR HIGHER *CHILLS OR SWEATING *NAUSEA AND VOMITING THAT IS NOT CONTROLLED WITH YOUR NAUSEA MEDICATION *UNUSUAL SHORTNESS OF BREATH *UNUSUAL BRUISING OR BLEEDING *URINARY PROBLEMS (pain or burning when urinating, or frequent urination) *BOWEL PROBLEMS (unusual diarrhea, constipation, pain near the anus) TENDERNESS IN MOUTH AND THROAT WITH OR WITHOUT PRESENCE OF ULCERS (sore throat, sores in mouth, or a toothache) UNUSUAL RASH, SWELLING OR PAIN  UNUSUAL VAGINAL DISCHARGE OR ITCHING   Items with * indicate a potential emergency and should be followed up as soon as possible or go to the Emergency Department if any problems should occur.  Please show the CHEMOTHERAPY ALERT CARD or IMMUNOTHERAPY ALERT CARD at check-in  to the Emergency Department and triage nurse.  Should you have questions after your visit or need to cancel or reschedule your appointment, please contact [facility]  Dept: [phone]  and follow the prompts.  Office hours are 8:00 a.m. to 4:30 p.m. Monday - Friday. Please note that voicemails left after 4:00 p.m. may not be returned until the following business day.  We are closed weekends and major holidays. You have access to a nurse at all times for urgent questions. Please call the main number to the clinic Dept: [phone] and follow the prompts.   For any non-urgent questions, you may also contact your provider using MyChart. We now offer e-Visits for anyone 65 and older to request care online for non-urgent symptoms. For details visit [email or web address].   Also download the MyChart app! Go to the app store, search "MyChart", open the app, select [facility], and log in with your MyChart username and password.

## 2022-05-31 NOTE — Progress Notes (Signed)
Per Dr Julien Nordmann , please recheck BP 30 mins after Clonidine. Ok to treat pt today with Gemzar  and pre Clonidine BP of 193/88.

## 2022-06-01 ENCOUNTER — Ambulatory Visit
Admission: RE | Admit: 2022-06-01 | Discharge: 2022-06-01 | Disposition: A | Discharge status: Home / Self Care | Payer: Medicare PPO | Source: Ambulatory Visit | Attending: Radiation Oncology | Admitting: Radiation Oncology

## 2022-06-01 ENCOUNTER — Other Ambulatory Visit: Payer: Self-pay

## 2022-06-01 DIAGNOSIS — Z5111 Encounter for antineoplastic chemotherapy: Secondary | ICD-10-CM | POA: Diagnosis not present

## 2022-06-01 LAB — RAD ONC ARIA SESSION SUMMARY
Course Elapsed Days: 1
Plan Fractions Treated to Date: 2
Plan Prescribed Dose Per Fraction: 1.8 Gy
Plan Total Fractions Prescribed: 11
Plan Total Prescribed Dose: 19.8 Gy
Reference Point Dosage Given to Date: 3.6 Gy
Reference Point Session Dosage Given: 1.8 Gy
Session Number: 2

## 2022-06-02 ENCOUNTER — Other Ambulatory Visit: Payer: Self-pay

## 2022-06-02 ENCOUNTER — Ambulatory Visit
Admission: RE | Admit: 2022-06-02 | Discharge: 2022-06-02 | Disposition: A | Discharge status: Home / Self Care | Payer: Medicare PPO | Source: Ambulatory Visit | Attending: Radiation Oncology | Admitting: Radiation Oncology

## 2022-06-02 DIAGNOSIS — Z5111 Encounter for antineoplastic chemotherapy: Secondary | ICD-10-CM | POA: Diagnosis not present

## 2022-06-02 LAB — RAD ONC ARIA SESSION SUMMARY
Course Elapsed Days: 2
Plan Fractions Treated to Date: 3
Plan Prescribed Dose Per Fraction: 1.8 Gy
Plan Total Fractions Prescribed: 11
Plan Total Prescribed Dose: 19.8 Gy
Reference Point Dosage Given to Date: 5.4 Gy
Reference Point Session Dosage Given: 1.8 Gy
Session Number: 3

## 2022-06-03 ENCOUNTER — Inpatient Hospital Stay: Payer: Medicare PPO

## 2022-06-03 ENCOUNTER — Other Ambulatory Visit: Payer: Self-pay

## 2022-06-03 ENCOUNTER — Ambulatory Visit: Payer: Medicare PPO

## 2022-06-03 ENCOUNTER — Ambulatory Visit
Admission: RE | Admit: 2022-06-03 | Discharge: 2022-06-03 | Disposition: A | Discharge status: Home / Self Care | Payer: Medicare PPO | Source: Ambulatory Visit | Attending: Radiation Oncology | Admitting: Radiation Oncology

## 2022-06-03 ENCOUNTER — Inpatient Hospital Stay (HOSPITAL_BASED_OUTPATIENT_CLINIC_OR_DEPARTMENT_OTHER): Payer: Medicare PPO | Admitting: Physician Assistant

## 2022-06-03 VITALS — BP 185/90 | HR 85 | Temp 98.1°F | Resp 18 | Wt 140.4 lb

## 2022-06-03 DIAGNOSIS — C678 Malignant neoplasm of overlapping sites of bladder: Secondary | ICD-10-CM

## 2022-06-03 DIAGNOSIS — Z5111 Encounter for antineoplastic chemotherapy: Secondary | ICD-10-CM | POA: Diagnosis not present

## 2022-06-03 DIAGNOSIS — I10 Essential (primary) hypertension: Secondary | ICD-10-CM | POA: Diagnosis not present

## 2022-06-03 LAB — RAD ONC ARIA SESSION SUMMARY
Course Elapsed Days: 3
Plan Fractions Treated to Date: 4
Plan Prescribed Dose Per Fraction: 1.8 Gy
Plan Total Fractions Prescribed: 11
Plan Total Prescribed Dose: 19.8 Gy
Reference Point Dosage Given to Date: 7.2 Gy
Reference Point Session Dosage Given: 1.8 Gy
Session Number: 4

## 2022-06-03 MED ORDER — CLONIDINE HCL 0.1 MG PO TABS
0.1000 mg | ORAL_TABLET | Freq: Once | ORAL | Status: AC
  Administered 2022-06-03: 0.1 mg via ORAL
  Filled 2022-06-03: qty 1

## 2022-06-03 MED ORDER — SODIUM CHLORIDE 0.9 % IV SOLN: Freq: Once | INTRAVENOUS | Status: AC

## 2022-06-03 MED ORDER — PROCHLORPERAZINE MALEATE 10 MG PO TABS
10.0000 mg | ORAL_TABLET | Freq: Once | ORAL | Status: AC
  Administered 2022-06-03: 10 mg via ORAL
  Filled 2022-06-03: qty 1

## 2022-06-03 MED ORDER — SODIUM CHLORIDE 0.9 % IV SOLN
27.0000 mg/m2 | Freq: Once | INTRAVENOUS | Status: AC
  Administered 2022-06-03: 46 mg via INTRAVENOUS
  Filled 2022-06-03: qty 1.21

## 2022-06-03 NOTE — Progress Notes (Signed)
Pt. blood pressure elevated. Pt. denies complaints of chest pain, dizziness, no shortness of breath noted. Anda Kraft PA notified, new order received and in for observation/assessment. Medicated pt. with Clonidine per orders. Blood pressure recheck 185/90 and per Anda Kraft PA, ok for discharge. Pt. left via wheelchair with daughter, no respiratory distress noted.

## 2022-06-03 NOTE — Progress Notes (Signed)
Symptom Management Consult note [facility]    Patient Care Team: Pcp, No as PCP - General    Name of the patient: Traci Powell  [id number]  04/19/1941   Date of visit: 06/03/2022   Chief Complaint/Reason for visit: elevated blood pressure   Current Therapy: Gemcitabine with concurrent radiation  Last treatment:  Day 1   Cycle 11 today   ASSESSMENT & PLAN: Patient is a 82 y.o. 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 Julien Nordmann.  I have viewed most recent oncology note and lab work.    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  -Here for treatment today - Next appointment with oncologist is 06/07/22   #) Hypertension -Known diagnosis for patient. -Per daughter BP medications are currently being adjusted by PCP at [facility] as they are working towards better BP control. -Patient has history of Alzheimer's and memory is baseline per daughter at chair side.  Patient has nonfocal neuroexam. -Patient has been hypertensive at prior visits and given clonidine.  She was given p.o. clonidine today and blood pressure recheck shows slightly improved systolic. -Engaged in shared decision making with patient's daughter and she is agreeable to closely monitor BP at the facility and reach out to PCP regarding this.  She does not wish to stay for further observation.  I feel this is reasonable given reassuring will exam.   Strict ED precautions discussed should symptoms worsen.     Heme/Onc History: Oncology History  Cancer of overlapping sites of bladder ([place])  04/22/2022 Cancer Staging   Staging form: Urinary Bladder, AJCC 8th Edition - Clinical stage from 04/22/2022: Stage II (cT2, cN0, cM0) - Signed by Freeman Caldron, PA-C on  04/28/2022 Histopathologic type: Transitional cell carcinoma, NOS Stage prefix: Initial diagnosis WHO/ISUP grade (low/high): High Grade Histologic grading system: 2 grade system   04/28/2022 Initial Diagnosis   Cancer of overlapping sites of bladder ([place])   05/24/2022 -  Chemotherapy   Patient is on Treatment Plan : BLADDER Gemcitabine Twice Weekly + XRT         Interval history-: Traci Powell is a 82 y.o. female with oncologic history as above seen in the infusion center today with chief complaint of elevated blood pressure. She is accompanied by her daughter who provides all of the history as patient is demented.   Patient's daughter is a Therapist, sports. She states that patient's blood pressure has been elevated and PCP at [facility] is adjusting her medications for better blood pressure control.  She states that hydralazine was added and both Norvasc and lisinopril doses were increased.  This change has only been in effect for x 3 days.  She has been here with patient during her chemotherapy today.  Patient has been acting like her typical self and is unchanged from baseline.  Patient has not had any recent illness.  No over-the-counter medications taken today prior to arrival.  Daughter did verify with facility that patient took her medications this morning as prescribed.     Review of Systems  Unable to perform ROS: Dementia        Allergies  Allergen Reactions   Iodinated Contrast Media Hives    Redness, striking, and hives Has to take high  dose benadryl      Past Medical History:  Diagnosis Date   Alzheimer's dementia ([place])    Cervical stenosis of spine    CKD (chronic kidney disease)    Gait disorder    Insomnia    Personal history of COVID-19 08/2020     Past Surgical History:  Procedure Laterality Date   ABDOMINAL HYSTERECTOMY     COLON SURGERY  2007   resection W/ ileostomy and reversal   TRANSURETHRAL RESECTION OF BLADDER TUMOR WITH MITOMYCIN-C N/A 04/22/2022    Procedure: TRANSURETHRAL RESECTION OF BLADDER TUMOR;  Surgeon: Irine Seal, MD;  Location: [facility];  Service: Urology;  Laterality: N/A;    Social History   Socioeconomic History   Marital status: Widowed    Spouse name: Not on file   Number of children: Not on file   Years of education: Not on file   Highest education level: Not on file  Occupational History   Not on file  Tobacco Use   Smoking status: Former    Packs/day: 0.25    Years: 5.00    Total pack years: 1.25    Types: Cigarettes    Quit date: 04/20/1998    Years since quitting: 24.1   Smokeless tobacco: Never  Substance and Sexual Activity   Alcohol use: Never   Drug use: Never   Sexual activity: Not on file  Other Topics Concern   Not on file  Social History Narrative   Not on file   Social Determinants of Health   Financial Resource Strain: Not on file  Food Insecurity: No Food Insecurity (05/24/2022)   Hunger Vital Sign    Worried About Running Out of Food in the Last Year: Never true    Ran Out of Food in the Last Year: Never true  Transportation Needs: No Transportation Needs (05/24/2022)   PRAPARE - Hydrologist (Medical): No    Lack of Transportation (Non-Medical): No  Physical Activity: Not on file  Stress: Not on file  Social Connections: Not on file  Intimate Partner Violence: Not on file    No family history on file.   Current Outpatient Medications:    acetaminophen (TYLENOL) 500 MG tablet, Take 1,000 mg by mouth in the morning and at bedtime., Disp: , Rfl:    amLODipine (NORVASC) 2.5 MG tablet, Take 2.5 mg by mouth daily., Disp: , Rfl:    diclofenac Sodium (VOLTAREN) 1 % GEL, Apply 3 g topically in the morning and at bedtime., Disp: , Rfl:    divalproex (DEPAKOTE ER) 250 MG 24 hr tablet, Take 250 mg by mouth 2 (two) times daily., Disp: , Rfl:    docusate sodium (COLACE) 100 MG capsule, Take 100 mg by mouth at bedtime., Disp: , Rfl:    lisinopril (ZESTRIL) 20 MG  tablet, Take 20 mg by mouth daily., Disp: , Rfl:    LORazepam (ATIVAN) 0.5 MG tablet, Take 0.5 mg by mouth as needed for anxiety. When going to outside appointments, Disp: , Rfl:    melatonin 3 MG TABS tablet, Take 10 mg by mouth at bedtime., Disp: , Rfl:    memantine (NAMENDA) 10 MG tablet, Take 10 mg by mouth 2 (two) times daily., Disp: , Rfl:    Menthol, Topical Analgesic, (BIOFREEZE) 10 % CREA, Apply 1 Application topically in the morning and at bedtime. Apply to knees, Disp: , Rfl:    ondansetron (ZOFRAN) 8 MG tablet, Take 8 mg by mouth every 8 (  eight) hours as needed for nausea or vomiting., Disp: , Rfl:    pravastatin (PRAVACHOL) 10 MG tablet, Take 10 mg by mouth daily., Disp: , Rfl:    traMADol (ULTRAM) 50 MG tablet, Take 25 mg by mouth 4 (four) times daily., Disp: , Rfl:    traZODone (DESYREL) 50 MG tablet, Take 50 mg by mouth at bedtime., Disp: , Rfl:   PHYSICAL EXAM: ECOG FS:1 - Symptomatic but completely ambulatory  T: 98.1  BP: 198/90  HR: 85  Resp: 18  O2: 100% RA Physical Exam Vitals and nursing note reviewed.  Constitutional:      Appearance: She is well-developed. She is not ill-appearing or toxic-appearing.  HENT:     Head: Normocephalic.     Nose: Nose normal.  Eyes:     Conjunctiva/sclera: Conjunctivae normal.  Neck:     Vascular: No JVD.  Cardiovascular:     Rate and Rhythm: Normal rate and regular rhythm.     Pulses: Normal pulses.     Heart sounds: Normal heart sounds.  Pulmonary:     Effort: Pulmonary effort is normal.     Breath sounds: Normal breath sounds.  Abdominal:     General: There is no distension.  Musculoskeletal:     Cervical back: Normal range of motion.  Skin:    General: Skin is warm and dry.  Neurological:     Comments: At baseline per daughter  CN 2-12 intact Strong and equal grip in all extremities. No facial droop, speech is clear        LABORATORY DATA: I have reviewed the data as listed    Latest Ref Rng & Units 05/31/2022    10:23 AM 05/24/2022    8:00 AM 05/12/2022    1:48 PM  CBC  WBC 4.0 - 10.5 K/uL 5.5  6.2  6.9   Hemoglobin 12.0 - 15.0 g/dL 12.1  12.8  12.7   Hematocrit 36.0 - 46.0 % 34.9  37.0  36.9   Platelets 150 - 400 K/uL 257  258  283         Latest Ref Rng & Units 05/31/2022   10:23 AM 05/24/2022    8:00 AM 05/12/2022    1:48 PM  CMP  Glucose 70 - 99 mg/dL 73  67  124   BUN 8 - 23 mg/dL '17  17  20   '$ Creatinine 0.44 - 1.00 mg/dL 0.77  0.84  0.85   Sodium 135 - 145 mmol/L 143  142  145   Potassium 3.5 - 5.1 mmol/L 4.3  4.2  4.4   Chloride 98 - 111 mmol/L 107  108  107   CO2 22 - 32 mmol/L 32  29  31   Calcium 8.9 - 10.3 mg/dL 10.4  10.3  10.5   Total Protein 6.5 - 8.1 g/dL 7.0  6.8  7.4   Total Bilirubin 0.3 - 1.2 mg/dL 0.3  0.4  0.3   Alkaline Phos 38 - 126 U/L 69  70  68   AST 15 - 41 U/L '15  17  15   '$ ALT 0 - 44 U/L '16  10  13        '$ RADIOGRAPHIC STUDIES (from last 24 hours if applicable) I have personally reviewed the radiological images as listed and agreed with the findings in the report. No results found.      Visit Diagnosis: 1. Hypertension, unspecified type   2. Cancer of overlapping sites of bladder ([place])  No orders of the defined types were placed in this encounter.   All questions were answered. The patient knows to call the clinic with any problems, questions or concerns. No barriers to learning was detected.  I have spent a total of 20 minutes minutes of face-to-face and non-face-to-face time, preparing to see the patient, obtaining and/or reviewing separately obtained history, performing a medically appropriate examination, counseling and educating the patient, ordering medications, documenting clinical information in the electronic health record.    Thank you for allowing me to participate in the care of this patient.    Barrie Folk, PA-C Department of Hematology/Oncology [facility] at [facility] Phone:  [phone]  [phone]    06/04/2022 12:20 PM

## 2022-06-03 NOTE — Patient Instructions (Signed)
[facility]  Discharge Instructions: Thank you for choosing [facility] to provide your oncology and hematology care.   If you have a lab appointment with the [facility], please go directly to the [facility] and check in at the registration area.   Wear comfortable clothing and clothing appropriate for easy access to any Portacath or PICC line.   We strive to give you quality time with your provider. You may need to reschedule your appointment if you arrive late (15 or more minutes).  Arriving late affects you and other patients whose appointments are after yours.  Also, if you miss three or more appointments without notifying the office, you may be dismissed from the clinic at the provider's discretion.      For prescription refill requests, have your pharmacy contact our office and allow 72 hours for refills to be completed.    Today you received the following chemotherapy and/or immunotherapy agent: Gemcitabine (Gemzar)   To help prevent nausea and vomiting after your treatment, we encourage you to take your nausea medication as directed.  BELOW ARE SYMPTOMS THAT SHOULD BE REPORTED IMMEDIATELY: *FEVER GREATER THAN 100.4 F (38 C) OR HIGHER *CHILLS OR SWEATING *NAUSEA AND VOMITING THAT IS NOT CONTROLLED WITH YOUR NAUSEA MEDICATION *UNUSUAL SHORTNESS OF BREATH *UNUSUAL BRUISING OR BLEEDING *URINARY PROBLEMS (pain or burning when urinating, or frequent urination) *BOWEL PROBLEMS (unusual diarrhea, constipation, pain near the anus) TENDERNESS IN MOUTH AND THROAT WITH OR WITHOUT PRESENCE OF ULCERS (sore throat, sores in mouth, or a toothache) UNUSUAL RASH, SWELLING OR PAIN  UNUSUAL VAGINAL DISCHARGE OR ITCHING   Items with * indicate a potential emergency and should be followed up as soon as possible or go to the Emergency Department if any problems should occur.  Please show the CHEMOTHERAPY ALERT CARD or IMMUNOTHERAPY ALERT CARD at  check-in to the Emergency Department and triage nurse.  Should you have questions after your visit or need to cancel or reschedule your appointment, please contact [facility]  Dept: [phone]  and follow the prompts.  Office hours are 8:00 a.m. to 4:30 p.m. Monday - Friday. Please note that voicemails left after 4:00 p.m. may not be returned until the following business day.  We are closed weekends and major holidays. You have access to a nurse at all times for urgent questions. Please call the main number to the clinic Dept: [phone] and follow the prompts.   For any non-urgent questions, you may also contact your provider using MyChart. We now offer e-Visits for anyone 28 and older to request care online for non-urgent symptoms. For details visit [email or web address].   Also download the MyChart app! Go to the app store, search "MyChart", open the app, select [facility], and log in with your MyChart username and password.  Gemcitabine Injection What is this medication? GEMCITABINE (jem SYE ta been) treats some types of cancer. It works by slowing down the growth of cancer cells. This medicine may be used for other purposes; ask your health care provider or pharmacist if you have questions. COMMON BRAND NAME(S): Gemzar, Infugem What should I tell my care team before I take this medication? They need to know if you have any of these conditions: Blood disorders Infection Kidney disease Liver disease Lung or breathing disease, such as asthma or COPD Recent or ongoing radiation therapy An unusual or allergic reaction to gemcitabine, other medications, foods, dyes, or preservatives If you or your partner  are pregnant or trying to get pregnant Breast-feeding How should I use this medication? This medication is injected into a vein. It is given by your care team in a hospital or clinic setting. Talk to your care team about the use of this medication in  children. Special care may be needed. Overdosage: If you think you have taken too much of this medicine contact a poison control center or emergency room at once. NOTE: This medicine is only for you. Do not share this medicine with others. What if I miss a dose? Keep appointments for follow-up doses. It is important not to miss your dose. Call your care team if you are unable to keep an appointment. What may interact with this medication? Interactions have not been studied. This list may not describe all possible interactions. Give your health care provider a list of all the medicines, herbs, non-prescription drugs, or dietary supplements you use. Also tell them if you smoke, drink alcohol, or use illegal drugs. Some items may interact with your medicine. What should I watch for while using this medication? Your condition will be monitored carefully while you are receiving this medication. This medication may make you feel generally unwell. This is not uncommon, as chemotherapy can affect healthy cells as well as cancer cells. Report any side effects. Continue your course of treatment even though you feel ill unless your care team tells you to stop. In some cases, you may be given additional medications to help with side effects. Follow all directions for their use. This medication may increase your risk of getting an infection. Call your care team for advice if you get a fever, chills, sore throat, or other symptoms of a cold or flu. Do not treat yourself. Try to avoid being around people who are sick. This medication may increase your risk to bruise or bleed. Call your care team if you notice any unusual bleeding. Be careful brushing or flossing your teeth or using a toothpick because you may get an infection or bleed more easily. If you have any dental work done, tell your dentist you are receiving this medication. Avoid taking medications that contain aspirin, acetaminophen, ibuprofen, naproxen,  or ketoprofen unless instructed by your care team. These medications may hide a fever. Talk to your care team if you or your partner wish to become pregnant or think you might be pregnant. This medication can cause serious birth defects if taken during pregnancy and for 6 months after the last dose. A negative pregnancy test is required before starting this medication. A reliable form of contraception is recommended while taking this medication and for 6 months after the last dose. Talk to your care team about effective forms of contraception. Do not father a child while taking this medication and for 3 months after the last dose. Use a condom while having sex during this time period. Do not breastfeed while taking this medication and for at least 1 week after the last dose. This medication may cause infertility. Talk to your care team if you are concerned about your fertility. What side effects may I notice from receiving this medication? Side effects that you should report to your care team as soon as possible: Allergic reactions--skin rash, itching, hives, swelling of the face, lips, tongue, or throat Capillary leak syndrome--stomach or muscle pain, unusual weakness or fatigue, feeling faint or lightheaded, decrease in the amount of urine, swelling of the ankles, hands, or feet, trouble breathing Infection--fever, chills, cough, sore throat, wounds that  don't heal, pain or trouble when passing urine, general feeling of discomfort or being unwell Liver injury--right upper belly pain, loss of appetite, nausea, light-colored stool, dark yellow or brown urine, yellowing skin or eyes, unusual weakness or fatigue Low red blood cell level--unusual weakness or fatigue, dizziness, headache, trouble breathing Lung injury--shortness of breath or trouble breathing, cough, spitting up blood, chest pain, fever Stomach pain, bloody diarrhea, pale skin, unusual weakness or fatigue, decrease in the amount of urine,  which may be signs of hemolytic uremic syndrome Sudden and severe headache, confusion, change in vision, seizures, which may be signs of posterior reversible encephalopathy syndrome (PRES) Unusual bruising or bleeding Side effects that usually do not require medical attention (report to your care team if they continue or are bothersome): Diarrhea Drowsiness Hair loss Nausea Pain, redness, or swelling with sores inside the mouth or throat Vomiting This list may not describe all possible side effects. Call your doctor for medical advice about side effects. You may report side effects to FDA at 1-800-FDA-1088. Where should I keep my medication? This medication is given in a hospital or clinic. It will not be stored at home. NOTE: This sheet is a summary. It may not cover all possible information. If you have questions about this medicine, talk to your doctor, pharmacist, or health care provider.  2023 Elsevier/Gold Standard (2007-06-24 00:00:00)

## 2022-06-04 ENCOUNTER — Ambulatory Visit: Payer: Medicare PPO

## 2022-06-04 ENCOUNTER — Encounter: Payer: Self-pay | Admitting: Internal Medicine

## 2022-06-04 ENCOUNTER — Ambulatory Visit
Admission: RE | Admit: 2022-06-04 | Discharge: 2022-06-04 | Disposition: A | Discharge status: Home / Self Care | Payer: Medicare PPO | Source: Ambulatory Visit | Attending: Radiation Oncology | Admitting: Radiation Oncology

## 2022-06-04 ENCOUNTER — Other Ambulatory Visit: Payer: Self-pay

## 2022-06-04 DIAGNOSIS — Z5111 Encounter for antineoplastic chemotherapy: Secondary | ICD-10-CM | POA: Diagnosis not present

## 2022-06-04 LAB — RAD ONC ARIA SESSION SUMMARY
Course Elapsed Days: 4
Plan Fractions Treated to Date: 5
Plan Prescribed Dose Per Fraction: 1.8 Gy
Plan Total Fractions Prescribed: 11
Plan Total Prescribed Dose: 19.8 Gy
Reference Point Dosage Given to Date: 9 Gy
Reference Point Session Dosage Given: 1.8 Gy
Session Number: 5

## 2022-06-04 NOTE — Progress Notes (Signed)
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 The patient is not a good surgical candidate for bladder resection.    PRIOR THERAPY: None  CURRENT THERAPY: Current chemoradiation with gemcitabine at a lower dose of 27 Mg/M2 on days 1, 4, 8, 11, 15, 18, 22 and 25 for the first cycle and similar treatment for the second cycle until day 18.  First dose January 12/2022.   INTERVAL HISTORY: Traci Powell 82 y.o. female returns to the clinic today for a follow-up visit accompanied by a staff member from the SNF. The patient is a long term resident of the facility for her dementia.  The staff member from her facility denies any changes in the patient's health. The patient is feeling well today without any concerning complaints.  She was last seen by Dr. Julien Nordmann on 05/25/2022.  Today, she denies any fever, chills, night sweats, or unexplained weight loss. The staff member from the SNF mentions that the patient does not always have a good appetite but that is not any different from her baseline. She gained 1 lb since last being seen.  Denies any nausea, vomiting, diarrhea, or constipation.  Denies any chest pain, shortness of breath, cough, or hemoptysis.  Denies any dysuria or hematuria.  Denies any back pain.  She is here today for evaluation repeat blood work before undergoing day 15 cycle #1 of treatment.    MEDICAL HISTORY: Past Medical History:  Diagnosis Date   Alzheimer's dementia [facility])    Cervical stenosis of spine    CKD (chronic kidney disease)    Gait disorder    Insomnia    Personal history of COVID-19 08/2020    ALLERGIES:  is allergic to iodinated contrast media.  MEDICATIONS:  Current Outpatient Medications  Medication Sig Dispense Refill   acetaminophen (TYLENOL) 500 MG tablet Take 1,000 mg by mouth in the  morning and at bedtime.     amLODipine (NORVASC) 2.5 MG tablet Take 2.5 mg by mouth daily.     diclofenac Sodium (VOLTAREN) 1 % GEL Apply 3 g topically in the morning and at bedtime.     divalproex (DEPAKOTE ER) 250 MG 24 hr tablet Take 250 mg by mouth 2 (two) times daily.     docusate sodium (COLACE) 100 MG capsule Take 100 mg by mouth at bedtime.     hydrALAZINE (APRESOLINE) 10 MG tablet Take 10 mg by mouth 2 (two) times daily.     hydrOXYzine (ATARAX) 10 MG tablet Take 10 mg by mouth at bedtime.     lisinopril (ZESTRIL) 20 MG tablet Take 20 mg by mouth daily.     LORazepam (ATIVAN) 0.5 MG tablet Take 0.5 mg by mouth as needed for anxiety. When going to outside appointments     melatonin 3 MG TABS tablet Take 10 mg by mouth at bedtime.     memantine (NAMENDA) 10 MG tablet Take 10 mg by mouth 2 (two) times daily.     Menthol, Topical Analgesic, (BIOFREEZE) 10 % CREA Apply 1 Application topically in the morning and at bedtime. Apply to knees     ondansetron (ZOFRAN) 8 MG tablet Take 8 mg by mouth every 8 (eight) hours as needed for nausea or vomiting.     pravastatin (PRAVACHOL) 10 MG tablet Take 10 mg by mouth daily.  traMADol (ULTRAM) 50 MG tablet Take 25 mg by mouth 4 (four) times daily.     traZODone (DESYREL) 50 MG tablet Take 50 mg by mouth at bedtime.     No current facility-administered medications for this visit.    SURGICAL HISTORY:  Past Surgical History:  Procedure Laterality Date   ABDOMINAL HYSTERECTOMY     COLON SURGERY  2007   resection W/ ileostomy and reversal   TRANSURETHRAL RESECTION OF BLADDER TUMOR WITH MITOMYCIN-C N/A 04/22/2022   Procedure: TRANSURETHRAL RESECTION OF BLADDER TUMOR;  Surgeon: Irine Seal, MD;  Location: [facility];  Service: Urology;  Laterality: N/A;    REVIEW OF SYSTEMS:   Review of Systems  Constitutional: Negative for appetite change, chills, fatigue, fever and unexpected weight change.  HENT: Negative for mouth sores, nosebleeds, sore throat  and trouble swallowing.   Eyes: Negative for eye problems and icterus.  Respiratory: Negative for cough, hemoptysis, shortness of breath and wheezing.   Cardiovascular: Negative for chest pain and leg swelling.  Gastrointestinal: Negative for abdominal pain, constipation, diarrhea, nausea and vomiting.  Genitourinary: Negative for bladder incontinence, difficulty urinating, dysuria, frequency and hematuria.   Musculoskeletal: Negative for back pain, gait problem, neck pain and neck stiffness.  Skin: Negative for itching and rash.  Neurological: Negative for dizziness, extremity weakness, gait problem, headaches, light-headedness and seizures.  Hematological: Negative for adenopathy. Does not bruise/bleed easily.  Psychiatric/Behavioral: Negative for confusion, depression and sleep disturbance. The patient is not nervous/anxious.     PHYSICAL EXAMINATION:  Blood pressure (!) 169/75, pulse 72, temperature 98.6 F (37 C), temperature source Oral, resp. rate 16, weight 150 lb 3.2 oz (68.1 kg), SpO2 100 %.  ECOG PERFORMANCE STATUS: 2-3  Physical Exam  Constitutional: Oriented to person, place, and time and well-developed, well-nourished, and in no distress.  HENT:  Head: Normocephalic and atraumatic.  Mouth/Throat: Oropharynx is clear and moist. No oropharyngeal exudate.  Eyes: Conjunctivae are normal. Right eye exhibits no discharge. Left eye exhibits no discharge. No scleral icterus.  Neck: Normal range of motion. Neck supple.  Cardiovascular: Normal rate, regular rhythm, normal heart sounds and intact distal pulses.   Pulmonary/Chest: Effort normal and breath sounds normal. No respiratory distress. No wheezes. No rales.  Abdominal: Soft. Bowel sounds are normal. Exhibits no distension and no mass. There is no tenderness.  Musculoskeletal: Normal range of motion. Exhibits no edema.  Lymphadenopathy:    No cervical adenopathy.  Neurological: Alert and oriented to person, place, and time.  Exhibits muscle wasting. Examined in the wheelchair.  Skin: Skin is warm and dry. No rash noted. Not diaphoretic. No erythema. No pallor.  Psychiatric: Mood, memory and judgment normal.  Vitals reviewed.  LABORATORY DATA: Lab Results  Component Value Date   WBC 3.7 (L) 06/07/2022   HGB 11.6 (L) 06/07/2022   HCT 33.1 (L) 06/07/2022   MCV 85.3 06/07/2022   PLT 219 06/07/2022      Chemistry      Component Value Date/Time   NA 144 06/07/2022 0906   K 4.5 06/07/2022 0906   CL 107 06/07/2022 0906   CO2 32 06/07/2022 0906   BUN 9 06/07/2022 0906   CREATININE 0.82 06/07/2022 0906      Component Value Date/Time   CALCIUM 10.7 (H) 06/07/2022 0906   ALKPHOS 67 06/07/2022 0906   AST 22 06/07/2022 0906   ALT 24 06/07/2022 0906   BILITOT 0.4 06/07/2022 0906       RADIOGRAPHIC STUDIES:  CT Chest Wo Contrast  Result Date: 05/22/2022 CLINICAL DATA:  Staging bladder cancer. EXAM: CT CHEST WITHOUT CONTRAST TECHNIQUE: Multidetector CT imaging of the chest was performed following the standard protocol without IV contrast. RADIATION DOSE REDUCTION: This exam was performed according to the departmental dose-optimization program which includes automated exposure control, adjustment of the mA and/or kV according to patient size and/or use of iterative reconstruction technique. COMPARISON:  CT urogram 03/30/2022. Multiple other comparison examination were appropriate FINDINGS: Cardiovascular: The heart is nonenlarged. Trace pericardial fluid. Grossly normal caliber thoracic aorta on this noncontrast examination. Scattered atherosclerotic calcifications are seen along the aorta and branch vessels including the coronary arteries. Mediastinum/Nodes: Slightly lobular appearance to the otherwise atrophic thyroid gland on the right with focal posterior nodule measuring 12 mm. This is stable going back to a cervical spine CT of 08/19/2020. No follow-up imaging is recommended at this time. On this non IV  contrast exam there is no specific abnormal lymph node enlargement seen in the axillary region, hilum or mediastinum. Normal caliber thoracic esophagus. Lungs/Pleura: There is linear opacity lung bases likely scar or atelectasis. There is also some scattered breathing motion. No consolidation, pneumothorax or effusion. There is a 6 x 3 mm nodular area seen along the course of the minor fissure on series 5, image 103. No other dominant lung nodule. Upper Abdomen: Adrenal glands in the upper abdomen are grossly preserved. Musculoskeletal: Curvature of the spine with some scattered degenerative changes. Motion. IMPRESSION: 6 mm nodule along the course of the minor fissure. Please correlate with any prior otherwise additional follow-up surveillance. Non-contrast chest CT at 6-12 months is recommended. If the nodule is stable at time of repeat CT, then future CT at 18-24 months (from today's scan) is considered optional for low-risk patients, but is recommended for high-risk patients. This recommendation follows the consensus statement: Guidelines for Management of Incidental Pulmonary Nodules Detected on CT Images: From the Fleischner Society 2017; Radiology 2017; 284:228-243. Aortic Atherosclerosis (ICD10-I70.0). Electronically Signed   By: Jill Side M.D.   On: 05/22/2022 12:55   XR KNEE 3 VIEW LEFT  Result Date: 05/20/2022 X-rays demonstrate advanced degenerative changes to the lateral patellofemoral compartments with moderate changes to the medial compartment  XR KNEE 3 VIEW RIGHT  Result Date: 05/20/2022 X-rays demonstrate advanced degenerative changes to the lateral patellofemoral compartments with moderate changes to the medial compartment    ASSESSMENT/PLAN:  This is a very pleasant 82 year old African-American female with unresectable stage II (T2, N0, M0) urothelial carcinoma of the bladder.  This was diagnosed in December 2023.  She is status post TURP with resection of the tumor.  The patient was  not a good candidate for bladder resection.  She is currently undergoing a course of concurrent chemoradiation with gemcitabine at a low dose of 27 mg/m on days 1, 4, 8, 11, 15, 18, 22, and 25.  Her first dose of treatment was on 05/24/2022.   Labs were reviewed.  Recommend that she proceeds with treatment today scheduled.  We will recheck her blood pressure in the infusion room.   Her calcium was a little high. Recommend that she hydrate more with water and avoid excessive calcium rich food or multivitamins until next lab visit  See her back for follow-up visit on 06/21/22 before infusion.   The patient was advised to call immediately if she has any concerning symptoms in the interval. The patient voices understanding of current disease status and treatment options and is in agreement with the current care plan. All questions  were answered. The patient knows to call the clinic with any problems, questions or concerns. We can certainly see the patient much sooner if necessary   No orders of the defined types were placed in this encounter.    The total time spent in the appointment was 20-29 minutes.   Cooper Stamp L Ladina Shutters, PA-C 06/07/22

## 2022-06-07 ENCOUNTER — Telehealth: Payer: Self-pay | Admitting: Medical Oncology

## 2022-06-07 ENCOUNTER — Inpatient Hospital Stay: Payer: Medicare PPO

## 2022-06-07 ENCOUNTER — Inpatient Hospital Stay (HOSPITAL_BASED_OUTPATIENT_CLINIC_OR_DEPARTMENT_OTHER): Payer: Medicare PPO | Admitting: Physician Assistant

## 2022-06-07 ENCOUNTER — Other Ambulatory Visit: Payer: Self-pay

## 2022-06-07 ENCOUNTER — Ambulatory Visit
Admission: RE | Admit: 2022-06-07 | Discharge: 2022-06-07 | Disposition: A | Discharge status: Home / Self Care | Payer: Medicare PPO | Source: Ambulatory Visit | Attending: Radiation Oncology | Admitting: Radiation Oncology

## 2022-06-07 VITALS — BP 169/75 | HR 72 | Temp 98.6°F | Resp 16 | Wt 150.2 lb

## 2022-06-07 VITALS — BP 148/58 | HR 70 | Temp 98.2°F | Resp 16

## 2022-06-07 DIAGNOSIS — Z5111 Encounter for antineoplastic chemotherapy: Secondary | ICD-10-CM | POA: Diagnosis not present

## 2022-06-07 DIAGNOSIS — C678 Malignant neoplasm of overlapping sites of bladder: Secondary | ICD-10-CM

## 2022-06-07 LAB — CMP (CANCER CENTER ONLY)
ALT: 24 U/L (ref 0–44)
AST: 22 U/L (ref 15–41)
Albumin: 3.9 g/dL (ref 3.5–5.0)
Alkaline Phosphatase: 67 U/L (ref 38–126)
Anion gap: 5 (ref 5–15)
BUN: 9 mg/dL (ref 8–23)
CO2: 32 mmol/L (ref 22–32)
Calcium: 10.7 mg/dL — ABNORMAL HIGH (ref 8.9–10.3)
Chloride: 107 mmol/L (ref 98–111)
Creatinine: 0.82 mg/dL (ref 0.44–1.00)
GFR, Estimated: >60 mL/min (ref >60)
Glucose, Bld: 86 mg/dL (ref 70–99)
Potassium: 4.5 mmol/L (ref 3.5–5.1)
Sodium: 144 mmol/L (ref 135–145)
Total Bilirubin: 0.4 mg/dL (ref 0.3–1.2)
Total Protein: 6.9 g/dL (ref 6.5–8.1)

## 2022-06-07 LAB — CBC WITH DIFFERENTIAL (CANCER CENTER ONLY)
Abs Immature Granulocytes: 0.02 10*3/uL (ref 0.00–0.07)
Basophils Absolute: 0 10*3/uL (ref 0.0–0.1)
Basophils Relative: 0 %
Eosinophils Absolute: 0.1 10*3/uL (ref 0.0–0.5)
Eosinophils Relative: 2 %
HCT: 33.1 % — ABNORMAL LOW (ref 36.0–46.0)
Hemoglobin: 11.6 g/dL — ABNORMAL LOW (ref 12.0–15.0)
Immature Granulocytes: 1 %
Lymphocytes Relative: 33 %
Lymphs Abs: 1.2 10*3/uL (ref 0.7–4.0)
MCH: 29.9 pg (ref 26.0–34.0)
MCHC: 35 g/dL (ref 30.0–36.0)
MCV: 85.3 fL (ref 80.0–100.0)
Monocytes Absolute: 0.2 10*3/uL (ref 0.1–1.0)
Monocytes Relative: 6 %
Neutro Abs: 2.1 10*3/uL (ref 1.7–7.7)
Neutrophils Relative %: 58 %
Platelet Count: 219 10*3/uL (ref 150–400)
RBC: 3.88 MIL/uL (ref 3.87–5.11)
RDW: 17.8 % — ABNORMAL HIGH (ref 11.5–15.5)
WBC Count: 3.7 10*3/uL — ABNORMAL LOW (ref 4.0–10.5)
nRBC: 0.8 % — ABNORMAL HIGH (ref 0.0–0.2)

## 2022-06-07 LAB — RAD ONC ARIA SESSION SUMMARY
Course Elapsed Days: 7
Plan Fractions Treated to Date: 6
Plan Prescribed Dose Per Fraction: 1.8 Gy
Plan Total Fractions Prescribed: 11
Plan Total Prescribed Dose: 19.8 Gy
Reference Point Dosage Given to Date: 10.8 Gy
Reference Point Session Dosage Given: 1.8 Gy
Session Number: 6

## 2022-06-07 MED ORDER — PROCHLORPERAZINE MALEATE 10 MG PO TABS
10.0000 mg | ORAL_TABLET | Freq: Once | ORAL | Status: AC
  Administered 2022-06-07: 10 mg via ORAL
  Filled 2022-06-07: qty 1

## 2022-06-07 MED ORDER — SODIUM CHLORIDE 0.9 % IV SOLN
27.0000 mg/m2 | Freq: Once | INTRAVENOUS | Status: AC
  Administered 2022-06-07: 46 mg via INTRAVENOUS
  Filled 2022-06-07: qty 1.21

## 2022-06-07 MED ORDER — SODIUM CHLORIDE 0.9 % IV SOLN: Freq: Once | INTRAVENOUS | Status: AC

## 2022-06-07 NOTE — Telephone Encounter (Signed)
Clarified with Darlene : "Her calcium was a little high. Recommend that she hydrate more with water and avoid excessive calcium rich food or multivitamins until next lab visit". She voiced understanding.

## 2022-06-07 NOTE — Patient Instructions (Signed)
[facility]  Discharge Instructions: Thank you for choosing [facility] to provide your oncology and hematology care.   If you have a lab appointment with the Cancer Center, please go directly to the Cancer Center and check in at the registration area.   Wear comfortable clothing and clothing appropriate for easy access to any Portacath or PICC line.   We strive to give you quality time with your provider. You may need to reschedule your appointment if you arrive late (15 or more minutes).  Arriving late affects you and other patients whose appointments are after yours.  Also, if you miss three or more appointments without notifying the office, you may be dismissed from the clinic at the provider's discretion.      For prescription refill requests, have your pharmacy contact our office and allow 72 hours for refills to be completed.    Today you received the following chemotherapy and/or immunotherapy agents: gemcitabine      To help prevent nausea and vomiting after your treatment, we encourage you to take your nausea medication as directed.  BELOW ARE SYMPTOMS THAT SHOULD BE REPORTED IMMEDIATELY: *FEVER GREATER THAN 100.4 F (38 C) OR HIGHER *CHILLS OR SWEATING *NAUSEA AND VOMITING THAT IS NOT CONTROLLED WITH YOUR NAUSEA MEDICATION *UNUSUAL SHORTNESS OF BREATH *UNUSUAL BRUISING OR BLEEDING *URINARY PROBLEMS (pain or burning when urinating, or frequent urination) *BOWEL PROBLEMS (unusual diarrhea, constipation, pain near the anus) TENDERNESS IN MOUTH AND THROAT WITH OR WITHOUT PRESENCE OF ULCERS (sore throat, sores in mouth, or a toothache) UNUSUAL RASH, SWELLING OR PAIN  UNUSUAL VAGINAL DISCHARGE OR ITCHING   Items with * indicate a potential emergency and should be followed up as soon as possible or go to the Emergency Department if any problems should occur.  Please show the CHEMOTHERAPY ALERT CARD or IMMUNOTHERAPY ALERT CARD at  check-in to the Emergency Department and triage nurse.  Should you have questions after your visit or need to cancel or reschedule your appointment, please contact [facility]  Dept: [phone]  and follow the prompts.  Office hours are 8:00 a.m. to 4:30 p.m. Monday - Friday. Please note that voicemails left after 4:00 p.m. may not be returned until the following business day.  We are closed weekends and major holidays. You have access to a nurse at all times for urgent questions. Please call the main number to the clinic Dept: [phone] and follow the prompts.   For any non-urgent questions, you may also contact your provider using MyChart. We now offer e-Visits for anyone 18 and older to request care online for non-urgent symptoms. For details visit [email or web address].   Also download the MyChart app! Go to the app store, search "MyChart", open the app, select [facility], and log in with your MyChart username and password.   

## 2022-06-08 ENCOUNTER — Other Ambulatory Visit: Payer: Self-pay

## 2022-06-08 ENCOUNTER — Telehealth: Payer: Self-pay | Admitting: Medical Oncology

## 2022-06-08 ENCOUNTER — Ambulatory Visit
Admission: RE | Admit: 2022-06-08 | Discharge: 2022-06-08 | Disposition: A | Discharge status: Home / Self Care | Payer: Medicare PPO | Source: Ambulatory Visit | Attending: Radiation Oncology | Admitting: Radiation Oncology

## 2022-06-08 DIAGNOSIS — Z5111 Encounter for antineoplastic chemotherapy: Secondary | ICD-10-CM | POA: Diagnosis not present

## 2022-06-08 LAB — RAD ONC ARIA SESSION SUMMARY
Course Elapsed Days: 8
Plan Fractions Treated to Date: 7
Plan Prescribed Dose Per Fraction: 1.8 Gy
Plan Total Fractions Prescribed: 11
Plan Total Prescribed Dose: 19.8 Gy
Reference Point Dosage Given to Date: 12.6 Gy
Reference Point Session Dosage Given: 1.8 Gy
Session Number: 7

## 2022-06-08 NOTE — Telephone Encounter (Signed)
error 

## 2022-06-08 NOTE — Telephone Encounter (Signed)
Family concerned about past hx hypertension at the cancer center.  Pt daughter was instructed to talk to PCP re BP and medication.

## 2022-06-09 ENCOUNTER — Ambulatory Visit
Admission: RE | Admit: 2022-06-09 | Discharge: 2022-06-09 | Disposition: A | Discharge status: Home / Self Care | Payer: Medicare PPO | Source: Ambulatory Visit | Attending: Radiation Oncology | Admitting: Radiation Oncology

## 2022-06-09 ENCOUNTER — Other Ambulatory Visit: Payer: Self-pay

## 2022-06-09 DIAGNOSIS — Z5111 Encounter for antineoplastic chemotherapy: Secondary | ICD-10-CM | POA: Diagnosis not present

## 2022-06-09 LAB — RAD ONC ARIA SESSION SUMMARY
Course Elapsed Days: 9
Plan Fractions Treated to Date: 8
Plan Prescribed Dose Per Fraction: 1.8 Gy
Plan Total Fractions Prescribed: 11
Plan Total Prescribed Dose: 19.8 Gy
Reference Point Dosage Given to Date: 14.4 Gy
Reference Point Session Dosage Given: 1.8 Gy
Session Number: 8

## 2022-06-10 ENCOUNTER — Ambulatory Visit
Admission: RE | Admit: 2022-06-10 | Discharge: 2022-06-10 | Disposition: A | Discharge status: Home / Self Care | Payer: Medicare PPO | Source: Ambulatory Visit | Attending: Radiation Oncology | Admitting: Radiation Oncology

## 2022-06-10 ENCOUNTER — Other Ambulatory Visit: Payer: Self-pay

## 2022-06-10 ENCOUNTER — Inpatient Hospital Stay: Payer: Medicare PPO

## 2022-06-10 VITALS — BP 164/69 | HR 74 | Temp 98.9°F | Resp 16 | Ht 65.0 in | Wt 146.5 lb

## 2022-06-10 DIAGNOSIS — Z5111 Encounter for antineoplastic chemotherapy: Secondary | ICD-10-CM | POA: Diagnosis not present

## 2022-06-10 DIAGNOSIS — C678 Malignant neoplasm of overlapping sites of bladder: Secondary | ICD-10-CM

## 2022-06-10 LAB — RAD ONC ARIA SESSION SUMMARY
Course Elapsed Days: 10
Plan Fractions Treated to Date: 9
Plan Prescribed Dose Per Fraction: 1.8 Gy
Plan Total Fractions Prescribed: 11
Plan Total Prescribed Dose: 19.8 Gy
Reference Point Dosage Given to Date: 16.2 Gy
Reference Point Session Dosage Given: 1.8 Gy
Session Number: 9

## 2022-06-10 MED ORDER — SODIUM CHLORIDE 0.9 % IV SOLN
27.0000 mg/m2 | Freq: Once | INTRAVENOUS | Status: AC
  Administered 2022-06-10: 46 mg via INTRAVENOUS
  Filled 2022-06-10: qty 1.21

## 2022-06-10 MED ORDER — PROCHLORPERAZINE MALEATE 10 MG PO TABS
10.0000 mg | ORAL_TABLET | Freq: Once | ORAL | Status: AC
  Administered 2022-06-10: 10 mg via ORAL
  Filled 2022-06-10: qty 1

## 2022-06-10 MED ORDER — SODIUM CHLORIDE 0.9 % IV SOLN: Freq: Once | INTRAVENOUS | Status: AC

## 2022-06-10 NOTE — Patient Instructions (Signed)
[facility]  Discharge Instructions: Thank you for choosing [facility] to provide your oncology and hematology care.   If you have a lab appointment with the Cancer Center, please go directly to the Cancer Center and check in at the registration area.   Wear comfortable clothing and clothing appropriate for easy access to any Portacath or PICC line.   We strive to give you quality time with your provider. You may need to reschedule your appointment if you arrive late (15 or more minutes).  Arriving late affects you and other patients whose appointments are after yours.  Also, if you miss three or more appointments without notifying the office, you may be dismissed from the clinic at the provider's discretion.      For prescription refill requests, have your pharmacy contact our office and allow 72 hours for refills to be completed.    Today you received the following chemotherapy and/or immunotherapy agents: gemcitabine      To help prevent nausea and vomiting after your treatment, we encourage you to take your nausea medication as directed.  BELOW ARE SYMPTOMS THAT SHOULD BE REPORTED IMMEDIATELY: *FEVER GREATER THAN 100.4 F (38 C) OR HIGHER *CHILLS OR SWEATING *NAUSEA AND VOMITING THAT IS NOT CONTROLLED WITH YOUR NAUSEA MEDICATION *UNUSUAL SHORTNESS OF BREATH *UNUSUAL BRUISING OR BLEEDING *URINARY PROBLEMS (pain or burning when urinating, or frequent urination) *BOWEL PROBLEMS (unusual diarrhea, constipation, pain near the anus) TENDERNESS IN MOUTH AND THROAT WITH OR WITHOUT PRESENCE OF ULCERS (sore throat, sores in mouth, or a toothache) UNUSUAL RASH, SWELLING OR PAIN  UNUSUAL VAGINAL DISCHARGE OR ITCHING   Items with * indicate a potential emergency and should be followed up as soon as possible or go to the Emergency Department if any problems should occur.  Please show the CHEMOTHERAPY ALERT CARD or IMMUNOTHERAPY ALERT CARD at  check-in to the Emergency Department and triage nurse.  Should you have questions after your visit or need to cancel or reschedule your appointment, please contact [facility]  Dept: [phone]  and follow the prompts.  Office hours are 8:00 a.m. to 4:30 p.m. Monday - Friday. Please note that voicemails left after 4:00 p.m. may not be returned until the following business day.  We are closed weekends and major holidays. You have access to a nurse at all times for urgent questions. Please call the main number to the clinic Dept: [phone] and follow the prompts.   For any non-urgent questions, you may also contact your provider using MyChart. We now offer e-Visits for anyone 18 and older to request care online for non-urgent symptoms. For details visit [email or web address].   Also download the MyChart app! Go to the app store, search "MyChart", open the app, select , and log in with your MyChart username and password.   

## 2022-06-11 ENCOUNTER — Ambulatory Visit
Admission: RE | Admit: 2022-06-11 | Discharge: 2022-06-11 | Disposition: A | Discharge status: Home / Self Care | Payer: Medicare PPO | Source: Ambulatory Visit | Attending: Radiation Oncology | Admitting: Radiation Oncology

## 2022-06-11 ENCOUNTER — Other Ambulatory Visit: Payer: Self-pay

## 2022-06-11 DIAGNOSIS — Z5111 Encounter for antineoplastic chemotherapy: Secondary | ICD-10-CM | POA: Diagnosis not present

## 2022-06-11 LAB — RAD ONC ARIA SESSION SUMMARY
Course Elapsed Days: 11
Plan Fractions Treated to Date: 10
Plan Prescribed Dose Per Fraction: 1.8 Gy
Plan Total Fractions Prescribed: 11
Plan Total Prescribed Dose: 19.8 Gy
Reference Point Dosage Given to Date: 18 Gy
Reference Point Session Dosage Given: 1.8 Gy
Session Number: 10

## 2022-06-14 ENCOUNTER — Inpatient Hospital Stay: Payer: Medicare PPO

## 2022-06-14 ENCOUNTER — Other Ambulatory Visit: Payer: Self-pay

## 2022-06-14 ENCOUNTER — Ambulatory Visit: Payer: Medicare PPO | Admitting: Internal Medicine

## 2022-06-14 ENCOUNTER — Ambulatory Visit
Admission: RE | Admit: 2022-06-14 | Discharge: 2022-06-14 | Disposition: A | Discharge status: Home / Self Care | Payer: Medicare PPO | Source: Ambulatory Visit | Attending: Radiation Oncology | Admitting: Radiation Oncology

## 2022-06-14 VITALS — BP 159/75 | HR 78 | Temp 98.9°F | Resp 18 | Ht 65.0 in | Wt 149.0 lb

## 2022-06-14 DIAGNOSIS — Z5111 Encounter for antineoplastic chemotherapy: Secondary | ICD-10-CM | POA: Diagnosis not present

## 2022-06-14 DIAGNOSIS — C678 Malignant neoplasm of overlapping sites of bladder: Secondary | ICD-10-CM

## 2022-06-14 LAB — CMP (CANCER CENTER ONLY)
ALT: 34 U/L (ref 0–44)
AST: 29 U/L (ref 15–41)
Albumin: 4.1 g/dL (ref 3.5–5.0)
Alkaline Phosphatase: 77 U/L (ref 38–126)
Anion gap: 8 (ref 5–15)
BUN: 16 mg/dL (ref 8–23)
CO2: 31 mmol/L (ref 22–32)
Calcium: 10.8 mg/dL — ABNORMAL HIGH (ref 8.9–10.3)
Chloride: 104 mmol/L (ref 98–111)
Creatinine: 0.91 mg/dL (ref 0.44–1.00)
GFR, Estimated: >60 mL/min (ref >60)
Glucose, Bld: 104 mg/dL — ABNORMAL HIGH (ref 70–99)
Potassium: 4.4 mmol/L (ref 3.5–5.1)
Sodium: 143 mmol/L (ref 135–145)
Total Bilirubin: 0.4 mg/dL (ref 0.3–1.2)
Total Protein: 7.7 g/dL (ref 6.5–8.1)

## 2022-06-14 LAB — CBC WITH DIFFERENTIAL (CANCER CENTER ONLY)
Abs Immature Granulocytes: 0.05 10*3/uL (ref 0.00–0.07)
Basophils Absolute: 0 10*3/uL (ref 0.0–0.1)
Basophils Relative: 0 %
Eosinophils Absolute: 0.1 10*3/uL (ref 0.0–0.5)
Eosinophils Relative: 2 %
HCT: 33.8 % — ABNORMAL LOW (ref 36.0–46.0)
Hemoglobin: 11.8 g/dL — ABNORMAL LOW (ref 12.0–15.0)
Immature Granulocytes: 1 %
Lymphocytes Relative: 30 %
Lymphs Abs: 1.2 10*3/uL (ref 0.7–4.0)
MCH: 30.3 pg (ref 26.0–34.0)
MCHC: 34.9 g/dL (ref 30.0–36.0)
MCV: 86.9 fL (ref 80.0–100.0)
Monocytes Absolute: 0.2 10*3/uL (ref 0.1–1.0)
Monocytes Relative: 6 %
Neutro Abs: 2.3 10*3/uL (ref 1.7–7.7)
Neutrophils Relative %: 61 %
Platelet Count: 228 10*3/uL (ref 150–400)
RBC: 3.89 MIL/uL (ref 3.87–5.11)
RDW: 19 % — ABNORMAL HIGH (ref 11.5–15.5)
WBC Count: 3.9 10*3/uL — ABNORMAL LOW (ref 4.0–10.5)
nRBC: 2.1 % — ABNORMAL HIGH (ref 0.0–0.2)

## 2022-06-14 MED ORDER — PROCHLORPERAZINE MALEATE 10 MG PO TABS
10.0000 mg | ORAL_TABLET | Freq: Once | ORAL | Status: AC
  Administered 2022-06-14: 10 mg via ORAL
  Filled 2022-06-14: qty 1

## 2022-06-14 MED ORDER — SODIUM CHLORIDE 0.9 % IV SOLN
27.0000 mg/m2 | Freq: Once | INTRAVENOUS | Status: AC
  Administered 2022-06-14: 46 mg via INTRAVENOUS
  Filled 2022-06-14: qty 1.21

## 2022-06-14 MED ORDER — SODIUM CHLORIDE 0.9 % IV SOLN: Freq: Once | INTRAVENOUS | Status: AC

## 2022-06-14 NOTE — Patient Instructions (Signed)
[facility]  Discharge Instructions: Thank you for choosing [facility] to provide your oncology and hematology care.   If you have a lab appointment with the Cancer Center, please go directly to the Cancer Center and check in at the registration area.   Wear comfortable clothing and clothing appropriate for easy access to any Portacath or PICC line.   We strive to give you quality time with your provider. You may need to reschedule your appointment if you arrive late (15 or more minutes).  Arriving late affects you and other patients whose appointments are after yours.  Also, if you miss three or more appointments without notifying the office, you may be dismissed from the clinic at the provider's discretion.      For prescription refill requests, have your pharmacy contact our office and allow 72 hours for refills to be completed.    Today you received the following chemotherapy and/or immunotherapy agents: Gemcitabine.       To help prevent nausea and vomiting after your treatment, we encourage you to take your nausea medication as directed.  BELOW ARE SYMPTOMS THAT SHOULD BE REPORTED IMMEDIATELY: *FEVER GREATER THAN 100.4 F (38 C) OR HIGHER *CHILLS OR SWEATING *NAUSEA AND VOMITING THAT IS NOT CONTROLLED WITH YOUR NAUSEA MEDICATION *UNUSUAL SHORTNESS OF BREATH *UNUSUAL BRUISING OR BLEEDING *URINARY PROBLEMS (pain or burning when urinating, or frequent urination) *BOWEL PROBLEMS (unusual diarrhea, constipation, pain near the anus) TENDERNESS IN MOUTH AND THROAT WITH OR WITHOUT PRESENCE OF ULCERS (sore throat, sores in mouth, or a toothache) UNUSUAL RASH, SWELLING OR PAIN  UNUSUAL VAGINAL DISCHARGE OR ITCHING   Items with * indicate a potential emergency and should be followed up as soon as possible or go to the Emergency Department if any problems should occur.  Please show the CHEMOTHERAPY ALERT CARD or IMMUNOTHERAPY ALERT CARD at  check-in to the Emergency Department and triage nurse.  Should you have questions after your visit or need to cancel or reschedule your appointment, please contact [facility]  Dept: [phone]  and follow the prompts.  Office hours are 8:00 a.m. to 4:30 p.m. Monday - Friday. Please note that voicemails left after 4:00 p.m. may not be returned until the following business day.  We are closed weekends and major holidays. You have access to a nurse at all times for urgent questions. Please call the main number to the clinic Dept: [phone] and follow the prompts.   For any non-urgent questions, you may also contact your provider using MyChart. We now offer e-Visits for anyone 18 and older to request care online for non-urgent symptoms. For details visit [email or web address].   Also download the MyChart app! Go to the app store, search "MyChart", open the app, select [facility], and log in with your MyChart username and password.   

## 2022-06-15 ENCOUNTER — Ambulatory Visit
Admission: RE | Admit: 2022-06-15 | Discharge: 2022-06-15 | Disposition: A | Discharge status: Home / Self Care | Payer: Medicare PPO | Source: Ambulatory Visit | Attending: Radiation Oncology | Admitting: Radiation Oncology

## 2022-06-15 ENCOUNTER — Other Ambulatory Visit: Payer: Self-pay

## 2022-06-15 DIAGNOSIS — Z5111 Encounter for antineoplastic chemotherapy: Secondary | ICD-10-CM | POA: Diagnosis not present

## 2022-06-15 LAB — RAD ONC ARIA SESSION SUMMARY
Course Elapsed Days: 15
Plan Fractions Treated to Date: 1
Plan Prescribed Dose Per Fraction: 1.8 Gy
Plan Total Fractions Prescribed: 25
Plan Total Prescribed Dose: 45 Gy
Reference Point Dosage Given to Date: 1.8 Gy
Reference Point Session Dosage Given: 1.8 Gy
Session Number: 12

## 2022-06-16 ENCOUNTER — Other Ambulatory Visit: Payer: Self-pay

## 2022-06-16 ENCOUNTER — Ambulatory Visit
Admission: RE | Admit: 2022-06-16 | Discharge: 2022-06-16 | Disposition: A | Discharge status: Home / Self Care | Payer: Medicare PPO | Source: Ambulatory Visit | Attending: Radiation Oncology | Admitting: Radiation Oncology

## 2022-06-16 DIAGNOSIS — Z5111 Encounter for antineoplastic chemotherapy: Secondary | ICD-10-CM | POA: Diagnosis not present

## 2022-06-16 LAB — RAD ONC ARIA SESSION SUMMARY
Course Elapsed Days: 16
Plan Fractions Treated to Date: 2
Plan Prescribed Dose Per Fraction: 1.8 Gy
Plan Total Fractions Prescribed: 25
Plan Total Prescribed Dose: 45 Gy
Reference Point Dosage Given to Date: 3.6 Gy
Reference Point Session Dosage Given: 1.8 Gy
Session Number: 13

## 2022-06-17 ENCOUNTER — Ambulatory Visit
Admission: RE | Admit: 2022-06-17 | Discharge: 2022-06-17 | Disposition: A | Discharge status: Home / Self Care | Payer: Medicare PPO | Source: Ambulatory Visit | Attending: Radiation Oncology | Admitting: Radiation Oncology

## 2022-06-17 ENCOUNTER — Other Ambulatory Visit: Payer: Self-pay

## 2022-06-17 ENCOUNTER — Inpatient Hospital Stay: Payer: Medicare PPO | Attending: Internal Medicine

## 2022-06-17 VITALS — BP 168/76 | HR 72 | Temp 98.7°F | Resp 17

## 2022-06-17 DIAGNOSIS — I129 Hypertensive chronic kidney disease with stage 1 through stage 4 chronic kidney disease, or unspecified chronic kidney disease: Secondary | ICD-10-CM | POA: Insufficient documentation

## 2022-06-17 DIAGNOSIS — Z510 Encounter for antineoplastic radiation therapy: Secondary | ICD-10-CM | POA: Insufficient documentation

## 2022-06-17 DIAGNOSIS — C678 Malignant neoplasm of overlapping sites of bladder: Secondary | ICD-10-CM | POA: Insufficient documentation

## 2022-06-17 DIAGNOSIS — Z9079 Acquired absence of other genital organ(s): Secondary | ICD-10-CM | POA: Insufficient documentation

## 2022-06-17 DIAGNOSIS — Z8616 Personal history of COVID-19: Secondary | ICD-10-CM | POA: Insufficient documentation

## 2022-06-17 DIAGNOSIS — Z5111 Encounter for antineoplastic chemotherapy: Secondary | ICD-10-CM | POA: Insufficient documentation

## 2022-06-17 DIAGNOSIS — Z87891 Personal history of nicotine dependence: Secondary | ICD-10-CM | POA: Insufficient documentation

## 2022-06-17 DIAGNOSIS — F02818 Dementia in other diseases classified elsewhere, unspecified severity, with other behavioral disturbance: Secondary | ICD-10-CM | POA: Insufficient documentation

## 2022-06-17 LAB — RAD ONC ARIA SESSION SUMMARY
Course Elapsed Days: 17
Plan Fractions Treated to Date: 3
Plan Prescribed Dose Per Fraction: 1.8 Gy
Plan Total Fractions Prescribed: 25
Plan Total Prescribed Dose: 45 Gy
Reference Point Dosage Given to Date: 5.4 Gy
Reference Point Session Dosage Given: 1.8 Gy
Session Number: 14

## 2022-06-17 MED ORDER — SODIUM CHLORIDE 0.9 % IV SOLN: Freq: Once | INTRAVENOUS | Status: AC

## 2022-06-17 MED ORDER — PROCHLORPERAZINE MALEATE 10 MG PO TABS
10.0000 mg | ORAL_TABLET | Freq: Once | ORAL | Status: AC
  Administered 2022-06-17: 10 mg via ORAL
  Filled 2022-06-17: qty 1

## 2022-06-17 MED ORDER — SODIUM CHLORIDE 0.9 % IV SOLN
27.0000 mg/m2 | Freq: Once | INTRAVENOUS | Status: AC
  Administered 2022-06-17: 46 mg via INTRAVENOUS
  Filled 2022-06-17: qty 1.21

## 2022-06-17 NOTE — Patient Instructions (Signed)
[facility]  Discharge Instructions: Thank you for choosing [facility] to provide your oncology and hematology care.   If you have a lab appointment with the Cancer Center, please go directly to the Cancer Center and check in at the registration area.   Wear comfortable clothing and clothing appropriate for easy access to any Portacath or PICC line.   We strive to give you quality time with your provider. You may need to reschedule your appointment if you arrive late (15 or more minutes).  Arriving late affects you and other patients whose appointments are after yours.  Also, if you miss three or more appointments without notifying the office, you may be dismissed from the clinic at the provider's discretion.      For prescription refill requests, have your pharmacy contact our office and allow 72 hours for refills to be completed.    Today you received the following chemotherapy and/or immunotherapy agents: Gemzar      To help prevent nausea and vomiting after your treatment, we encourage you to take your nausea medication as directed.  BELOW ARE SYMPTOMS THAT SHOULD BE REPORTED IMMEDIATELY: *FEVER GREATER THAN 100.4 F (38 C) OR HIGHER *CHILLS OR SWEATING *NAUSEA AND VOMITING THAT IS NOT CONTROLLED WITH YOUR NAUSEA MEDICATION *UNUSUAL SHORTNESS OF BREATH *UNUSUAL BRUISING OR BLEEDING *URINARY PROBLEMS (pain or burning when urinating, or frequent urination) *BOWEL PROBLEMS (unusual diarrhea, constipation, pain near the anus) TENDERNESS IN MOUTH AND THROAT WITH OR WITHOUT PRESENCE OF ULCERS (sore throat, sores in mouth, or a toothache) UNUSUAL RASH, SWELLING OR PAIN  UNUSUAL VAGINAL DISCHARGE OR ITCHING   Items with * indicate a potential emergency and should be followed up as soon as possible or go to the Emergency Department if any problems should occur.  Please show the CHEMOTHERAPY ALERT CARD or IMMUNOTHERAPY ALERT CARD at check-in  to the Emergency Department and triage nurse.  Should you have questions after your visit or need to cancel or reschedule your appointment, please contact [facility]  Dept: [phone]  and follow the prompts.  Office hours are 8:00 a.m. to 4:30 p.m. Monday - Friday. Please note that voicemails left after 4:00 p.m. may not be returned until the following business day.  We are closed weekends and major holidays. You have access to a nurse at all times for urgent questions. Please call the main number to the clinic Dept: [phone] and follow the prompts.   For any non-urgent questions, you may also contact your provider using MyChart. We now offer e-Visits for anyone 18 and older to request care online for non-urgent symptoms. For details visit [email or web address].   Also download the MyChart app! Go to the app store, search "MyChart", open the app, select [facility], and log in with your MyChart username and password.   

## 2022-06-18 ENCOUNTER — Ambulatory Visit
Admission: RE | Admit: 2022-06-18 | Discharge: 2022-06-18 | Disposition: A | Discharge status: Home / Self Care | Payer: Medicare PPO | Source: Ambulatory Visit | Attending: Radiation Oncology | Admitting: Radiation Oncology

## 2022-06-18 ENCOUNTER — Ambulatory Visit: Payer: Medicare PPO

## 2022-06-18 ENCOUNTER — Other Ambulatory Visit: Payer: Self-pay

## 2022-06-18 DIAGNOSIS — Z510 Encounter for antineoplastic radiation therapy: Secondary | ICD-10-CM | POA: Diagnosis not present

## 2022-06-18 LAB — RAD ONC ARIA SESSION SUMMARY
Course Elapsed Days: 18
Plan Fractions Treated to Date: 4
Plan Prescribed Dose Per Fraction: 1.8 Gy
Plan Total Fractions Prescribed: 25
Plan Total Prescribed Dose: 45 Gy
Reference Point Dosage Given to Date: 7.2 Gy
Reference Point Session Dosage Given: 1.8 Gy
Session Number: 15

## 2022-06-18 NOTE — Progress Notes (Unsigned)
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 The patient is not a good surgical candidate for bladder resection.     PRIOR THERAPY: None  CURRENT THERAPY: Current chemoradiation with gemcitabine at a lower dose of 27 Mg/M2 on days 1, 4, 8, 11, 15, 18, 22 and 25 for the first cycle and similar treatment for the second cycle until day 18.  First dose May 24, 2022.    INTERVAL HISTORY: Keylin Ferryman 82 y.o. female returns to the clinic today for a follow-up visit unaccompanied today. She has advanced dementia. She is pleasant but not able to give a history. To her knowledge she is feeling fine today.  The patient is a long term resident of the facility for her dementia. She was last seen by myself on 06/07/22.  Today, she denies any fever, chills, night sweats, or unexplained weight loss. Denies any nausea, vomiting, diarrhea, or constipation.  Denies any chest pain, shortness of breath, cough, or hemoptysis.  Denies any dysuria or hematuria.  Denies any back pain.  She is here today for evaluation repeat blood work before undergoing day 1 cycle #2 of treatment.   MEDICAL HISTORY: Past Medical History:  Diagnosis Date   Alzheimer's dementia [facility])    Cervical stenosis of spine    CKD (chronic kidney disease)    Gait disorder    Insomnia    Personal history of COVID-19 08/2020    ALLERGIES:  is allergic to iodinated contrast media.  MEDICATIONS:  Current Outpatient Medications  Medication Sig Dispense Refill   acetaminophen (TYLENOL) 500 MG tablet Take 1,000 mg by mouth in the morning and at bedtime.     amLODipine (NORVASC) 5 MG tablet Take 5 mg by mouth daily.     diclofenac Sodium (VOLTAREN) 1 % GEL Apply 3 g topically in the morning and at bedtime.     divalproex (DEPAKOTE ER) 250 MG 24 hr tablet Take 250 mg  by mouth 2 (two) times daily.     docusate sodium (COLACE) 100 MG capsule Take 100 mg by mouth at bedtime.     hydrALAZINE (APRESOLINE) 10 MG tablet Take 10 mg by mouth 2 (two) times daily.     hydrOXYzine (ATARAX) 10 MG tablet Take 10 mg by mouth at bedtime.     lisinopril (ZESTRIL) 20 MG tablet Take 20 mg by mouth daily.     LORazepam (ATIVAN) 0.5 MG tablet Take 0.5 mg by mouth as needed for anxiety. When going to outside appointments     melatonin 3 MG TABS tablet Take 10 mg by mouth at bedtime.     memantine (NAMENDA) 10 MG tablet Take 10 mg by mouth 2 (two) times daily.     Menthol, Topical Analgesic, (BIOFREEZE) 10 % CREA Apply 1 Application topically in the morning and at bedtime. Apply to knees     ondansetron (ZOFRAN) 8 MG tablet Take 8 mg by mouth every 8 (eight) hours as needed for nausea or vomiting.     pravastatin (PRAVACHOL) 10 MG tablet Take 10 mg by mouth daily.     traMADol (ULTRAM) 50 MG tablet Take 25 mg by mouth 4 (four) times daily.     traZODone (DESYREL) 50 MG tablet Take 50 mg by mouth at bedtime.     No current facility-administered medications for this  visit.    SURGICAL HISTORY:  Past Surgical History:  Procedure Laterality Date   ABDOMINAL HYSTERECTOMY     COLON SURGERY  2007   resection W/ ileostomy and reversal   TRANSURETHRAL RESECTION OF BLADDER TUMOR WITH MITOMYCIN-C N/A 04/22/2022   Procedure: TRANSURETHRAL RESECTION OF BLADDER TUMOR;  Surgeon: Irine Seal, MD;  Location: [facility];  Service: Urology;  Laterality: N/A;    REVIEW OF SYSTEMS:   Constitutional: Negative for appetite change, chills, fatigue, fever and unexpected weight change.  HENT: Negative for mouth sores, nosebleeds, sore throat and trouble swallowing.   Eyes: Negative for eye problems and icterus.  Respiratory: Negative for cough, hemoptysis, shortness of breath and wheezing.   Cardiovascular: Negative for chest pain and leg swelling.  Gastrointestinal: Negative for abdominal pain,  constipation, diarrhea, nausea and vomiting.  Genitourinary: Negative for bladder incontinence, difficulty urinating, dysuria, frequency and hematuria.   Musculoskeletal: Negative for back pain, gait problem, neck pain and neck stiffness.  Skin: Negative for itching and rash.  Neurological: Negative for dizziness, extremity weakness, gait problem, headaches, light-headedness and seizures.  Hematological: Negative for adenopathy. Does not bruise/bleed easily.  Psychiatric/Behavioral: Positive for dementia. Negative for confusion, depression and sleep disturbance. The patient is not nervous/anxious.     PHYSICAL EXAMINATION:  Blood pressure (!) 152/54, pulse 69, temperature 98.4 F (36.9 C), temperature source Oral, resp. rate 14, weight 150 lb (68 kg), SpO2 98 %.  ECOG PERFORMANCE STATUS: 2  Physical Exam  Constitutional: Oriented to person but not to place or time and well-developed, well-nourished, and in no distress.  HENT:  Head: Normocephalic and atraumatic.  Mouth/Throat: Oropharynx is clear and moist. No oropharyngeal exudate.  Eyes: Conjunctivae are normal. Right eye exhibits no discharge. Left eye exhibits no discharge. No scleral icterus.  Neck: Normal range of motion. Neck supple.  Cardiovascular: Normal rate, regular rhythm, normal heart sounds and intact distal pulses.   Pulmonary/Chest: Effort normal and breath sounds normal. No respiratory distress. No wheezes. No rales.  Abdominal: Soft. Bowel sounds are normal. Exhibits no distension and no mass. There is no tenderness.  Musculoskeletal: Normal range of motion. Exhibits no edema.  Lymphadenopathy:    No cervical adenopathy.  Neurological: Alert and oriented to person, place, and time. Exhibits muscle wasting. Examined in the wheelchair although she is ambulatory. Skin: Skin is warm and dry. No rash noted. Not diaphoretic. No erythema. No pallor.  Psychiatric: Mood and judgment normal. Positive for memory deficits.   Vitals reviewed.  LABORATORY DATA: Lab Results  Component Value Date   WBC 3.6 (L) 06/21/2022   HGB 10.8 (L) 06/21/2022   HCT 30.7 (L) 06/21/2022   MCV 88.0 06/21/2022   PLT 240 06/21/2022      Chemistry      Component Value Date/Time   NA 142 06/21/2022 1139   K 4.3 06/21/2022 1139   CL 106 06/21/2022 1139   CO2 30 06/21/2022 1139   BUN 25 (H) 06/21/2022 1139   CREATININE 0.92 06/21/2022 1139      Component Value Date/Time   CALCIUM 9.6 06/21/2022 1139   ALKPHOS 66 06/21/2022 1139   AST 23 06/21/2022 1139   ALT 24 06/21/2022 1139   BILITOT 0.4 06/21/2022 1139       RADIOGRAPHIC STUDIES:  No results found.   ASSESSMENT/PLAN:  This is a very pleasant 82 year old African-American female with unresectable stage II (T2, N0, M0) urothelial carcinoma of the bladder.  This was diagnosed in December 2023.  She is status  post TURP with resection of the tumor.  The patient was not a good candidate for bladder resection.   She is currently undergoing a course of concurrent chemoradiation with gemcitabine at a low dose of 27 mg/m on days 1, 4, 8, 11, 15, 18, 22, and 25.  Her first dose of treatment was on 05/24/2022.    Labs were reviewed.  Recommend that she proceeds with treatment today scheduled.   She was unaccompanied today. She has dementia and lives in a SNF. She was wandering around the clinic when left alone in the exam room prior to my arrival. I have written instructions on her SNF paperwork to make sure she is always accompanied the entire duration of the appointment due to her dementia.   We will see her back for a follow up in 2 weeks with her infusion that day.    The patient was advised to call immediately if she has any concerning symptoms in the interval. The patient voices understanding of current disease status and treatment options and is in agreement with the current care plan. All questions were answered. The patient knows to call the clinic with any  problems, questions or concerns. We can certainly see the patient much sooner if necessary       No orders of the defined types were placed in this encounter.     The total time spent in the appointment was 20-29 minutes.   Ebert Forrester L Kyel Purk, PA-C 06/21/22

## 2022-06-21 ENCOUNTER — Other Ambulatory Visit: Payer: Self-pay

## 2022-06-21 ENCOUNTER — Inpatient Hospital Stay (HOSPITAL_BASED_OUTPATIENT_CLINIC_OR_DEPARTMENT_OTHER): Payer: Medicare PPO | Admitting: Physician Assistant

## 2022-06-21 ENCOUNTER — Inpatient Hospital Stay: Payer: Medicare PPO

## 2022-06-21 ENCOUNTER — Ambulatory Visit: Payer: Medicare PPO | Admitting: Internal Medicine

## 2022-06-21 ENCOUNTER — Ambulatory Visit
Admission: RE | Admit: 2022-06-21 | Discharge: 2022-06-21 | Disposition: A | Discharge status: Home / Self Care | Payer: Medicare PPO | Source: Ambulatory Visit | Attending: Radiation Oncology | Admitting: Radiation Oncology

## 2022-06-21 VITALS — BP 151/79 | HR 65 | Resp 16

## 2022-06-21 VITALS — BP 152/54 | HR 69 | Temp 98.4°F | Resp 14 | Wt 150.0 lb

## 2022-06-21 DIAGNOSIS — Z510 Encounter for antineoplastic radiation therapy: Secondary | ICD-10-CM | POA: Diagnosis not present

## 2022-06-21 DIAGNOSIS — Z5111 Encounter for antineoplastic chemotherapy: Secondary | ICD-10-CM | POA: Diagnosis not present

## 2022-06-21 DIAGNOSIS — C678 Malignant neoplasm of overlapping sites of bladder: Secondary | ICD-10-CM

## 2022-06-21 LAB — CBC WITH DIFFERENTIAL (CANCER CENTER ONLY)
Abs Immature Granulocytes: 0.06 10*3/uL (ref 0.00–0.07)
Basophils Absolute: 0 10*3/uL (ref 0.0–0.1)
Basophils Relative: 0 %
Eosinophils Absolute: 0.1 10*3/uL (ref 0.0–0.5)
Eosinophils Relative: 2 %
HCT: 30.7 % — ABNORMAL LOW (ref 36.0–46.0)
Hemoglobin: 10.8 g/dL — ABNORMAL LOW (ref 12.0–15.0)
Immature Granulocytes: 2 %
Lymphocytes Relative: 16 %
Lymphs Abs: 0.6 10*3/uL — ABNORMAL LOW (ref 0.7–4.0)
MCH: 30.9 pg (ref 26.0–34.0)
MCHC: 35.2 g/dL (ref 30.0–36.0)
MCV: 88 fL (ref 80.0–100.0)
Monocytes Absolute: 0.2 10*3/uL (ref 0.1–1.0)
Monocytes Relative: 6 %
Neutro Abs: 2.7 10*3/uL (ref 1.7–7.7)
Neutrophils Relative %: 74 %
Platelet Count: 240 10*3/uL (ref 150–400)
RBC: 3.49 MIL/uL — ABNORMAL LOW (ref 3.87–5.11)
RDW: 19.9 % — ABNORMAL HIGH (ref 11.5–15.5)
WBC Count: 3.6 10*3/uL — ABNORMAL LOW (ref 4.0–10.5)
nRBC: 1.6 % — ABNORMAL HIGH (ref 0.0–0.2)

## 2022-06-21 LAB — RAD ONC ARIA SESSION SUMMARY
Course Elapsed Days: 21
Plan Fractions Treated to Date: 5
Plan Prescribed Dose Per Fraction: 1.8 Gy
Plan Total Fractions Prescribed: 25
Plan Total Prescribed Dose: 45 Gy
Reference Point Dosage Given to Date: 9 Gy
Reference Point Session Dosage Given: 1.8 Gy
Session Number: 16

## 2022-06-21 LAB — CMP (CANCER CENTER ONLY)
ALT: 24 U/L (ref 0–44)
AST: 23 U/L (ref 15–41)
Albumin: 3.6 g/dL (ref 3.5–5.0)
Alkaline Phosphatase: 66 U/L (ref 38–126)
Anion gap: 6 (ref 5–15)
BUN: 25 mg/dL — ABNORMAL HIGH (ref 8–23)
CO2: 30 mmol/L (ref 22–32)
Calcium: 9.6 mg/dL (ref 8.9–10.3)
Chloride: 106 mmol/L (ref 98–111)
Creatinine: 0.92 mg/dL (ref 0.44–1.00)
GFR, Estimated: >60 mL/min (ref >60)
Glucose, Bld: 83 mg/dL (ref 70–99)
Potassium: 4.3 mmol/L (ref 3.5–5.1)
Sodium: 142 mmol/L (ref 135–145)
Total Bilirubin: 0.4 mg/dL (ref 0.3–1.2)
Total Protein: 6.5 g/dL (ref 6.5–8.1)

## 2022-06-21 MED ORDER — SODIUM CHLORIDE 0.9 % IV SOLN
27.0000 mg/m2 | Freq: Once | INTRAVENOUS | Status: AC
  Administered 2022-06-21: 46 mg via INTRAVENOUS
  Filled 2022-06-21: qty 1.21

## 2022-06-21 MED ORDER — SODIUM CHLORIDE 0.9 % IV SOLN: Freq: Once | INTRAVENOUS | Status: AC

## 2022-06-21 MED ORDER — PROCHLORPERAZINE MALEATE 10 MG PO TABS
10.0000 mg | ORAL_TABLET | Freq: Once | ORAL | Status: AC
  Administered 2022-06-21: 10 mg via ORAL
  Filled 2022-06-21: qty 1

## 2022-06-22 ENCOUNTER — Other Ambulatory Visit: Payer: Self-pay

## 2022-06-22 ENCOUNTER — Ambulatory Visit
Admission: RE | Admit: 2022-06-22 | Discharge: 2022-06-22 | Disposition: A | Discharge status: Home / Self Care | Payer: Medicare PPO | Source: Ambulatory Visit | Attending: Radiation Oncology | Admitting: Radiation Oncology

## 2022-06-22 DIAGNOSIS — Z510 Encounter for antineoplastic radiation therapy: Secondary | ICD-10-CM | POA: Diagnosis not present

## 2022-06-22 LAB — RAD ONC ARIA SESSION SUMMARY
Course Elapsed Days: 22
Plan Fractions Treated to Date: 6
Plan Prescribed Dose Per Fraction: 1.8 Gy
Plan Total Fractions Prescribed: 25
Plan Total Prescribed Dose: 45 Gy
Reference Point Dosage Given to Date: 10.8 Gy
Reference Point Session Dosage Given: 1.8 Gy
Session Number: 17

## 2022-06-23 ENCOUNTER — Ambulatory Visit
Admission: RE | Admit: 2022-06-23 | Discharge: 2022-06-23 | Disposition: A | Discharge status: Home / Self Care | Payer: Medicare PPO | Source: Ambulatory Visit | Attending: Radiation Oncology | Admitting: Radiation Oncology

## 2022-06-23 ENCOUNTER — Other Ambulatory Visit: Payer: Self-pay

## 2022-06-23 DIAGNOSIS — Z510 Encounter for antineoplastic radiation therapy: Secondary | ICD-10-CM | POA: Diagnosis not present

## 2022-06-23 LAB — RAD ONC ARIA SESSION SUMMARY
Course Elapsed Days: 23
Plan Fractions Treated to Date: 7
Plan Prescribed Dose Per Fraction: 1.8 Gy
Plan Total Fractions Prescribed: 25
Plan Total Prescribed Dose: 45 Gy
Reference Point Dosage Given to Date: 12.6 Gy
Reference Point Session Dosage Given: 1.8 Gy
Session Number: 18

## 2022-06-24 ENCOUNTER — Ambulatory Visit: Payer: Medicare PPO

## 2022-06-24 ENCOUNTER — Encounter: Payer: Self-pay | Admitting: Internal Medicine

## 2022-06-24 ENCOUNTER — Inpatient Hospital Stay: Payer: Medicare PPO

## 2022-06-24 VITALS — BP 156/70 | HR 73 | Temp 98.7°F | Resp 16

## 2022-06-24 DIAGNOSIS — C678 Malignant neoplasm of overlapping sites of bladder: Secondary | ICD-10-CM

## 2022-06-24 DIAGNOSIS — Z510 Encounter for antineoplastic radiation therapy: Secondary | ICD-10-CM | POA: Diagnosis not present

## 2022-06-24 MED ORDER — SODIUM CHLORIDE 0.9 % IV SOLN
27.0000 mg/m2 | Freq: Once | INTRAVENOUS | Status: AC
  Administered 2022-06-24: 46 mg via INTRAVENOUS
  Filled 2022-06-24: qty 1.21

## 2022-06-24 MED ORDER — PROCHLORPERAZINE MALEATE 10 MG PO TABS
10.0000 mg | ORAL_TABLET | Freq: Once | ORAL | Status: AC
  Administered 2022-06-24: 10 mg via ORAL
  Filled 2022-06-24: qty 1

## 2022-06-24 MED ORDER — SODIUM CHLORIDE 0.9 % IV SOLN: Freq: Once | INTRAVENOUS | Status: AC

## 2022-06-24 NOTE — Patient Instructions (Signed)
[facility]   Discharge Instructions: Thank you for choosing [facility] to provide your oncology and hematology care.   If you have a lab appointment with the [facility], please go directly to the [facility] and check in at the registration area.   Wear comfortable clothing and clothing appropriate for easy access to any Portacath or PICC line.   We strive to give you quality time with your provider. You may need to reschedule your appointment if you arrive late (15 or more minutes).  Arriving late affects you and other patients whose appointments are after yours.  Also, if you miss three or more appointments without notifying the office, you may be dismissed from the clinic at the provider's discretion.      For prescription refill requests, have your pharmacy contact our office and allow 72 hours for refills to be completed.    Today you received the following chemotherapy and/or immunotherapy agents: Gemcitabine (Gemzar)      To help prevent nausea and vomiting after your treatment, we encourage you to take your nausea medication as directed.  BELOW ARE SYMPTOMS THAT SHOULD BE REPORTED IMMEDIATELY: *FEVER GREATER THAN 100.4 F (38 C) OR HIGHER *CHILLS OR SWEATING *NAUSEA AND VOMITING THAT IS NOT CONTROLLED WITH YOUR NAUSEA MEDICATION *UNUSUAL SHORTNESS OF BREATH *UNUSUAL BRUISING OR BLEEDING *URINARY PROBLEMS (pain or burning when urinating, or frequent urination) *BOWEL PROBLEMS (unusual diarrhea, constipation, pain near the anus) TENDERNESS IN MOUTH AND THROAT WITH OR WITHOUT PRESENCE OF ULCERS (sore throat, sores in mouth, or a toothache) UNUSUAL RASH, SWELLING OR PAIN  UNUSUAL VAGINAL DISCHARGE OR ITCHING   Items with * indicate a potential emergency and should be followed up as soon as possible or go to the Emergency Department if any problems should occur.  Please show the CHEMOTHERAPY ALERT CARD or IMMUNOTHERAPY ALERT  CARD at check-in to the Emergency Department and triage nurse.  Should you have questions after your visit or need to cancel or reschedule your appointment, please contact [facility]  Dept: [phone]  and follow the prompts.  Office hours are 8:00 a.m. to 4:30 p.m. Monday - Friday. Please note that voicemails left after 4:00 p.m. may not be returned until the following business day.  We are closed weekends and major holidays. You have access to a nurse at all times for urgent questions. Please call the main number to the clinic Dept: [phone] and follow the prompts.   For any non-urgent questions, you may also contact your provider using MyChart. We now offer e-Visits for anyone 75 and older to request care online for non-urgent symptoms. For details visit [email or web address].   Also download the MyChart app! Go to the app store, search "MyChart", open the app, select [facility], and log in with your MyChart username and password.

## 2022-06-25 ENCOUNTER — Other Ambulatory Visit: Payer: Self-pay

## 2022-06-25 ENCOUNTER — Ambulatory Visit
Admission: RE | Admit: 2022-06-25 | Discharge: 2022-06-25 | Disposition: A | Discharge status: Home / Self Care | Payer: Medicare PPO | Source: Ambulatory Visit | Attending: Radiation Oncology | Admitting: Radiation Oncology

## 2022-06-25 ENCOUNTER — Telehealth: Payer: Self-pay

## 2022-06-25 DIAGNOSIS — Z510 Encounter for antineoplastic radiation therapy: Secondary | ICD-10-CM | POA: Diagnosis not present

## 2022-06-25 LAB — RAD ONC ARIA SESSION SUMMARY
Course Elapsed Days: 25
Plan Fractions Treated to Date: 8
Plan Prescribed Dose Per Fraction: 1.8 Gy
Plan Total Fractions Prescribed: 25
Plan Total Prescribed Dose: 45 Gy
Reference Point Dosage Given to Date: 14.4 Gy
Reference Point Session Dosage Given: 1.8 Gy
Session Number: 19

## 2022-06-25 NOTE — Telephone Encounter (Signed)
RN called patient daughter Dimitra Malafronte) to check in with her about Mrs. Lory appointment for radiation treatment.  Unable to get her and her voicemail box is full so couldn't leave message.

## 2022-06-28 ENCOUNTER — Other Ambulatory Visit: Payer: Self-pay

## 2022-06-28 ENCOUNTER — Ambulatory Visit: Payer: Medicare PPO | Admitting: Internal Medicine

## 2022-06-28 ENCOUNTER — Ambulatory Visit
Admission: RE | Admit: 2022-06-28 | Discharge: 2022-06-28 | Disposition: A | Discharge status: Home / Self Care | Payer: Medicare PPO | Source: Ambulatory Visit | Attending: Radiation Oncology | Admitting: Radiation Oncology

## 2022-06-28 ENCOUNTER — Inpatient Hospital Stay: Payer: Medicare PPO

## 2022-06-28 ENCOUNTER — Inpatient Hospital Stay (HOSPITAL_BASED_OUTPATIENT_CLINIC_OR_DEPARTMENT_OTHER): Payer: Medicare PPO | Admitting: Physician Assistant

## 2022-06-28 VITALS — BP 157/75 | HR 77 | Temp 97.9°F | Resp 16 | Wt 150.8 lb

## 2022-06-28 DIAGNOSIS — T801XXA Vascular complications following infusion, transfusion and therapeutic injection, initial encounter: Secondary | ICD-10-CM | POA: Diagnosis not present

## 2022-06-28 DIAGNOSIS — C678 Malignant neoplasm of overlapping sites of bladder: Secondary | ICD-10-CM

## 2022-06-28 DIAGNOSIS — Z5111 Encounter for antineoplastic chemotherapy: Secondary | ICD-10-CM

## 2022-06-28 DIAGNOSIS — Z510 Encounter for antineoplastic radiation therapy: Secondary | ICD-10-CM | POA: Diagnosis not present

## 2022-06-28 LAB — CBC WITH DIFFERENTIAL (CANCER CENTER ONLY)
Abs Immature Granulocytes: 0.12 10*3/uL — ABNORMAL HIGH (ref 0.00–0.07)
Basophils Absolute: 0 10*3/uL (ref 0.0–0.1)
Basophils Relative: 0 %
Eosinophils Absolute: 0.2 10*3/uL (ref 0.0–0.5)
Eosinophils Relative: 3 %
HCT: 28.1 % — ABNORMAL LOW (ref 36.0–46.0)
Hemoglobin: 10 g/dL — ABNORMAL LOW (ref 12.0–15.0)
Immature Granulocytes: 2 %
Lymphocytes Relative: 6 %
Lymphs Abs: 0.4 10*3/uL — ABNORMAL LOW (ref 0.7–4.0)
MCH: 31.2 pg (ref 26.0–34.0)
MCHC: 35.6 g/dL (ref 30.0–36.0)
MCV: 87.5 fL (ref 80.0–100.0)
Monocytes Absolute: 0.3 10*3/uL (ref 0.1–1.0)
Monocytes Relative: 6 %
Neutro Abs: 4.8 10*3/uL (ref 1.7–7.7)
Neutrophils Relative %: 83 %
Platelet Count: 206 10*3/uL (ref 150–400)
RBC: 3.21 MIL/uL — ABNORMAL LOW (ref 3.87–5.11)
RDW: 19.7 % — ABNORMAL HIGH (ref 11.5–15.5)
WBC Count: 5.7 10*3/uL (ref 4.0–10.5)
nRBC: 0.9 % — ABNORMAL HIGH (ref 0.0–0.2)

## 2022-06-28 LAB — RAD ONC ARIA SESSION SUMMARY
Course Elapsed Days: 28
Plan Fractions Treated to Date: 9
Plan Prescribed Dose Per Fraction: 1.8 Gy
Plan Total Fractions Prescribed: 25
Plan Total Prescribed Dose: 45 Gy
Reference Point Dosage Given to Date: 16.2 Gy
Reference Point Session Dosage Given: 1.8 Gy
Session Number: 20

## 2022-06-28 LAB — CMP (CANCER CENTER ONLY)
ALT: 28 U/L (ref 0–44)
AST: 29 U/L (ref 15–41)
Albumin: 3.7 g/dL (ref 3.5–5.0)
Alkaline Phosphatase: 60 U/L (ref 38–126)
Anion gap: 7 (ref 5–15)
BUN: 23 mg/dL (ref 8–23)
CO2: 28 mmol/L (ref 22–32)
Calcium: 9.8 mg/dL (ref 8.9–10.3)
Chloride: 107 mmol/L (ref 98–111)
Creatinine: 1.04 mg/dL — ABNORMAL HIGH (ref 0.44–1.00)
GFR, Estimated: 55 mL/min — ABNORMAL LOW (ref >60)
Glucose, Bld: 86 mg/dL (ref 70–99)
Potassium: 4.6 mmol/L (ref 3.5–5.1)
Sodium: 142 mmol/L (ref 135–145)
Total Bilirubin: 0.4 mg/dL (ref 0.3–1.2)
Total Protein: 6.8 g/dL (ref 6.5–8.1)

## 2022-06-28 MED ORDER — SODIUM CHLORIDE 0.9 % IV SOLN: Freq: Once | INTRAVENOUS | Status: AC

## 2022-06-28 MED ORDER — SODIUM CHLORIDE 0.9 % IV SOLN
27.0000 mg/m2 | Freq: Once | INTRAVENOUS | Status: AC
  Administered 2022-06-28: 46 mg via INTRAVENOUS
  Filled 2022-06-28: qty 1.21

## 2022-06-28 MED ORDER — PROCHLORPERAZINE MALEATE 10 MG PO TABS
10.0000 mg | ORAL_TABLET | Freq: Once | ORAL | Status: AC
  Administered 2022-06-28: 10 mg via ORAL
  Filled 2022-06-28: qty 1

## 2022-06-28 NOTE — Progress Notes (Signed)
Symptom Management Consult note [facility]    Patient Care Team: Pcp, No as PCP - General    Name / MRN / DOB: Traci Powell  [id number]  1940-12-30   Date of visit: 06/28/2022   Chief Complaint/Reason for visit: IV extravasation   Current Therapy: Gemzar  Last treatment:  Day 4   Cycle 2 on 06/21/22   ASSESSMENT & PLAN: Patient is a 82 y.o. 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 Julien Nordmann.  I have viewed most recent oncology note and lab work.    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  Nursing staff estimates 40 mL of Gemzar and 16 mL of saline infiltrated. -On exam patient has edema around IV site.  IV was removed or 1 mL was able to be aspirated.  Compartments are soft in right upper extremity and she is neurovascularly intact. -Area was outlined with skin marker.  Patient will return tomorrow for recheck. -Oncologist agrees with plan to discontinue treatment for today.  Plan was discussed with staff from [facility] who accompanies patient as she has history of dementia.      Heme/Onc History: Oncology History  Cancer of overlapping sites of bladder ([place])  04/22/2022 Cancer Staging   Staging form: Urinary Bladder, AJCC 8th Edition - Clinical stage from 04/22/2022: Stage II (cT2, cN0, cM0) - Signed by Freeman Caldron, PA-C on 04/28/2022 Histopathologic type: Transitional cell carcinoma, NOS Stage prefix: Initial diagnosis WHO/ISUP grade (low/high): High Grade Histologic grading system: 2 grade system   04/28/2022 Initial Diagnosis   Cancer of overlapping sites of bladder ([place])    05/24/2022 -  Chemotherapy   Patient is on Treatment Plan : BLADDER Gemcitabine Twice Weekly + XRT         Interval history-: [name] is a 82 y.o. female with oncologic history as above seen in the infusion center today with chief complaint of IV extravasation.  This happened just prior to my arrival.  Patient was difficult IV access and ultimately IV was placed by IV team.  Patient complained of pain around IV site during beginning of infusion.  RN noticed swelling and pus treatment.  She was able to aspirate 1 mL.  Gemzar had been infusing with normal saline.  Patient states that her arm kind of hurts although cannot describe it much further.  Patient has pertinent history of dementia. Unable to get additional history from patient.      Review of Systems  Unable to perform ROS: Dementia        Allergies  Allergen Reactions   Iodinated Contrast Media Hives    Redness, striking, and hives Has to take high dose benadryl      Past Medical History:  Diagnosis Date   Alzheimer's dementia ([place])    Cervical stenosis of spine    CKD (chronic kidney disease)    Gait disorder    Insomnia    Personal history of COVID-19 08/2020     Past Surgical History:  Procedure Laterality Date   ABDOMINAL HYSTERECTOMY     COLON SURGERY  2007   resection W/ ileostomy and  reversal   TRANSURETHRAL RESECTION OF BLADDER TUMOR WITH MITOMYCIN-C N/A 04/22/2022   Procedure: TRANSURETHRAL RESECTION OF BLADDER TUMOR;  Surgeon: Irine Seal, MD;  Location: [facility];  Service: Urology;  Laterality: N/A;    Social History   Socioeconomic History   Marital status: Widowed    Spouse name: Not on file   Number of children: Not on file   Years of education: Not on file   Highest education level: Not on file  Occupational History   Not on file  Tobacco Use   Smoking status: Former    Packs/day: 0.25    Years: 5.00    Total pack years: 1.25    Types: Cigarettes    Quit date: 04/20/1998     Years since quitting: 24.2   Smokeless tobacco: Never  Substance and Sexual Activity   Alcohol use: Never   Drug use: Never   Sexual activity: Not on file  Other Topics Concern   Not on file  Social History Narrative   Not on file   Social Determinants of Health   Financial Resource Strain: Not on file  Food Insecurity: No Food Insecurity (05/24/2022)   Hunger Vital Sign    Worried About Running Out of Food in the Last Year: Never true    Ran Out of Food in the Last Year: Never true  Transportation Needs: No Transportation Needs (05/24/2022)   PRAPARE - Hydrologist (Medical): No    Lack of Transportation (Non-Medical): No  Physical Activity: Not on file  Stress: Not on file  Social Connections: Not on file  Intimate Partner Violence: Not on file    No family history on file.   Current Outpatient Medications:    acetaminophen (TYLENOL) 500 MG tablet, Take 1,000 mg by mouth in the morning and at bedtime., Disp: , Rfl:    amLODipine (NORVASC) 5 MG tablet, Take 5 mg by mouth daily., Disp: , Rfl:    diclofenac Sodium (VOLTAREN) 1 % GEL, Apply 3 g topically in the morning and at bedtime., Disp: , Rfl:    divalproex (DEPAKOTE ER) 250 MG 24 hr tablet, Take 250 mg by mouth 2 (two) times daily., Disp: , Rfl:    docusate sodium (COLACE) 100 MG capsule, Take 100 mg by mouth at bedtime., Disp: , Rfl:    hydrALAZINE (APRESOLINE) 10 MG tablet, Take 10 mg by mouth 2 (two) times daily., Disp: , Rfl:    hydrOXYzine (ATARAX) 10 MG tablet, Take 10 mg by mouth at bedtime., Disp: , Rfl:    lisinopril (ZESTRIL) 20 MG tablet, Take 20 mg by mouth daily., Disp: , Rfl:    LORazepam (ATIVAN) 0.5 MG tablet, Take 0.5 mg by mouth as needed for anxiety. When going to outside appointments, Disp: , Rfl:    melatonin 3 MG TABS tablet, Take 10 mg by mouth at bedtime., Disp: , Rfl:    memantine (NAMENDA) 10 MG tablet, Take 10 mg by mouth 2 (two) times daily., Disp: , Rfl:    Menthol,  Topical Analgesic, (BIOFREEZE) 10 % CREA, Apply 1 Application topically in the morning and at bedtime. Apply to knees, Disp: , Rfl:    ondansetron (ZOFRAN) 8 MG tablet, Take 8 mg by mouth every 8 (eight) hours as needed for nausea or vomiting., Disp: , Rfl:    pravastatin (PRAVACHOL) 10 MG tablet, Take 10 mg by mouth daily., Disp: , Rfl:    traMADol (ULTRAM) 50 MG tablet, Take 25 mg by  mouth 4 (four) times daily., Disp: , Rfl:    traZODone (DESYREL) 50 MG tablet, Take 50 mg by mouth at bedtime., Disp: , Rfl:  No current facility-administered medications for this visit.  Facility-Administered Medications Ordered in Other Visits:    0.9 %  sodium chloride infusion, , Intravenous, Once, Curt Bears, MD  PHYSICAL EXAM: ECOG FS:1 - Symptomatic but completely ambulatory    Vitals:   06/28/22 1133  BP: (!) 157/75  Pulse: 77  Resp: 16  Temp: 97.9 F (36.6 C)  TempSrc: Traci  SpO2: 100%  Weight: 150 lb 12 oz (68.4 kg)   Physical Exam Vitals and nursing note reviewed.  Constitutional:      Appearance: She is well-developed. She is not ill-appearing or toxic-appearing.  HENT:     Head: Normocephalic.     Nose: Nose normal.  Eyes:     Conjunctiva/sclera: Conjunctivae normal.  Neck:     Vascular: No JVD.  Cardiovascular:     Rate and Rhythm: Normal rate and regular rhythm.     Pulses: Normal pulses.     Heart sounds: Normal heart sounds.  Pulmonary:     Effort: Pulmonary effort is normal.     Breath sounds: Normal breath sounds.  Abdominal:     General: There is no distension.  Musculoskeletal:     Cervical back: Normal range of motion.     Comments: Compartments in right upper extremity are soft.  Strong radial pulse. Full ROM of right upper extremity.  Strong and equal grip strength in bilateral upper extremities.  Skin:    General: Skin is warm and dry.     Capillary Refill: Capillary refill takes less than 2 seconds.     Comments: Edema noted to right forearm without  overlying erythema.  Tender to palpation.  No rash or streaking.  Neurological:     Comments: At baseline per [facility] staff at bedside        LABORATORY DATA: I have reviewed the data as listed    Latest Ref Rng & Units 06/28/2022   10:24 AM 06/21/2022   11:39 AM 06/14/2022    1:19 PM  CBC  WBC 4.0 - 10.5 K/uL 5.7  3.6  3.9   Hemoglobin 12.0 - 15.0 g/dL 10.0  10.8  11.8   Hematocrit 36.0 - 46.0 % 28.1  30.7  33.8   Platelets 150 - 400 K/uL 206  240  228         Latest Ref Rng & Units 06/28/2022   10:24 AM 06/21/2022   11:39 AM 06/14/2022    1:19 PM  CMP  Glucose 70 - 99 mg/dL 86  83  104   BUN 8 - 23 mg/dL 23  25  16   $ Creatinine 0.44 - 1.00 mg/dL 1.04  0.92  0.91   Sodium 135 - 145 mmol/L 142  142  143   Potassium 3.5 - 5.1 mmol/L 4.6  4.3  4.4   Chloride 98 - 111 mmol/L 107  106  104   CO2 22 - 32 mmol/L 28  30  31   $ Calcium 8.9 - 10.3 mg/dL 9.8  9.6  10.8   Total Protein 6.5 - 8.1 g/dL 6.8  6.5  7.7   Total Bilirubin 0.3 - 1.2 mg/dL 0.4  0.4  0.4   Alkaline Phos 38 - 126 U/L 60  66  77   AST 15 - 41 U/L 29  23  29   $ ALT 0 -  44 U/L 28  24  34        RADIOGRAPHIC STUDIES (from last 24 hours if applicable) I have personally reviewed the radiological images as listed and agreed with the findings in the report. No results found.      Visit Diagnosis: 1. Cancer of overlapping sites of bladder ([place])   2. Encounter for antineoplastic chemotherapy   3. Intravenous infiltration, initial encounter      No orders of the defined types were placed in this encounter.   All questions were answered. The patient knows to call the clinic with any problems, questions or concerns. No barriers to learning was detected.  I have spent a total of 20 minutes minutes of face-to-face and non-face-to-face time, preparing to see the patient, obtaining and/or reviewing separately obtained history, performing a medically appropriate examination, counseling and educating the patient,  documenting clinical information in the electronic health record, and care coordination (communications with other health care professionals or caregivers).    Thank you for allowing me to participate in the care of this patient.    Barrie Folk, PA-C Department of Hematology/Oncology [facility] at [facility] Phone: [phone]  [phone]    06/28/2022 4:40 PM

## 2022-06-28 NOTE — Progress Notes (Signed)
Patient had an extravasation at about 2m of Gemzar and about 133mof saline. Patient has a swollen area 5cm X 6cm- no redness. Patient not complaining of pain now that the IV has been removed- unless you palpate- then soreness. Traci CaveA- C, symptom management came and assessed her arm. Extravasation paperwork was given to the facility worker that accompanied her. Instructions to put cold on area per pharmacy instructions/protocol. Appointment tomorrow to assess site. Facility aware. Dr. MoJulien Nordmannware- instructions were given that the infusion was complete- do not give any more today- with no intention of completing this dose another day. Patient/facility worker asked her daughter be called and informed. Daughter, Traci Laymanas listed as HCPOA so contact was attempted, but after several tries her other daughter, Traci Bootyas called. Traci Bootytated that she would call and share the information with her sister when she was available.

## 2022-06-28 NOTE — Addendum Note (Signed)
Addended by: Jethro Bolus A on: 06/28/2022 02:44 PM   Modules accepted: Orders

## 2022-06-28 NOTE — Patient Instructions (Signed)
[facility]  Discharge Instructions: Thank you for choosing [facility] to provide your oncology and hematology care.   If you have a lab appointment with the [facility], please go directly to the [facility] and check in at the registration area.   Wear comfortable clothing and clothing appropriate for easy access to any Portacath or PICC line.   We strive to give you quality time with your provider. You may need to reschedule your appointment if you arrive late (15 or more minutes).  Arriving late affects you and other patients whose appointments are after yours.  Also, if you miss three or more appointments without notifying the office, you may be dismissed from the clinic at the provider's discretion.      For prescription refill requests, have your pharmacy contact our office and allow 72 hours for refills to be completed.    Today you received the following chemotherapy and/or immunotherapy agents: Gemzar      To help prevent nausea and vomiting after your treatment, we encourage you to take your nausea medication as directed.  BELOW ARE SYMPTOMS THAT SHOULD BE REPORTED IMMEDIATELY: *FEVER GREATER THAN 100.4 F (38 C) OR HIGHER *CHILLS OR SWEATING *NAUSEA AND VOMITING THAT IS NOT CONTROLLED WITH YOUR NAUSEA MEDICATION *UNUSUAL SHORTNESS OF BREATH *UNUSUAL BRUISING OR BLEEDING *URINARY PROBLEMS (pain or burning when urinating, or frequent urination) *BOWEL PROBLEMS (unusual diarrhea, constipation, pain near the anus) TENDERNESS IN MOUTH AND THROAT WITH OR WITHOUT PRESENCE OF ULCERS (sore throat, sores in mouth, or a toothache) UNUSUAL RASH, SWELLING OR PAIN  UNUSUAL VAGINAL DISCHARGE OR ITCHING   Items with * indicate a potential emergency and should be followed up as soon as possible or go to the Emergency Department if any problems should occur.  Please show the CHEMOTHERAPY ALERT CARD or IMMUNOTHERAPY ALERT CARD at check-in  to the Emergency Department and triage nurse.  Should you have questions after your visit or need to cancel or reschedule your appointment, please contact [facility]  Dept: [phone]  and follow the prompts.  Office hours are 8:00 a.m. to 4:30 p.m. Monday - Friday. Please note that voicemails left after 4:00 p.m. may not be returned until the following business day.  We are closed weekends and major holidays. You have access to a nurse at all times for urgent questions. Please call the main number to the clinic Dept: [phone] and follow the prompts.   For any non-urgent questions, you may also contact your provider using MyChart. We now offer e-Visits for anyone 49 and older to request care online for non-urgent symptoms. For details visit [email or web address].   Also download the MyChart app! Go to the app store, search "MyChart", open the app, select [facility], and log in with your MyChart username and password.

## 2022-06-28 NOTE — Progress Notes (Signed)
Patient seen by  Patient did not See provider today  Vitals are within treatment parameters.  Labs reviewed: and are within treatment parameters."  Per physician team, patient is ready for treatment and there are NO modifications to the treatment plan.  Provider is aware of sore hands and swelling in lower legs and feet.  No further recommendations from provider at this time.

## 2022-06-28 NOTE — Progress Notes (Signed)
Patient has been having pain and swelling in her ankles- about 2+ swelling and "crippling pain" per pt report. She also says her hands are sore. Contacted Dr. Julien Nordmann. MD aware and states to go ahead with treatment today. See note from MD nurse.

## 2022-06-29 ENCOUNTER — Inpatient Hospital Stay: Payer: Medicare PPO

## 2022-06-29 ENCOUNTER — Other Ambulatory Visit: Payer: Self-pay

## 2022-06-29 ENCOUNTER — Ambulatory Visit
Admission: RE | Admit: 2022-06-29 | Discharge: 2022-06-29 | Disposition: A | Discharge status: Home / Self Care | Payer: Medicare PPO | Source: Ambulatory Visit | Attending: Radiation Oncology | Admitting: Radiation Oncology

## 2022-06-29 DIAGNOSIS — Z510 Encounter for antineoplastic radiation therapy: Secondary | ICD-10-CM | POA: Diagnosis not present

## 2022-06-29 LAB — RAD ONC ARIA SESSION SUMMARY
Course Elapsed Days: 29
Plan Fractions Treated to Date: 10
Plan Prescribed Dose Per Fraction: 1.8 Gy
Plan Total Fractions Prescribed: 25
Plan Total Prescribed Dose: 45 Gy
Reference Point Dosage Given to Date: 18 Gy
Reference Point Session Dosage Given: 1.8 Gy
Session Number: 21

## 2022-06-29 NOTE — Progress Notes (Signed)
IV extravasation site assessed for 24 hour follow-up visit. Site was clean, dry and intact. Minor bruising noted at IV site with no redness, swelling or warmth. Patient denied pain with palpation.

## 2022-06-30 ENCOUNTER — Inpatient Hospital Stay: Payer: Medicare PPO

## 2022-06-30 ENCOUNTER — Other Ambulatory Visit: Payer: Self-pay

## 2022-06-30 ENCOUNTER — Ambulatory Visit
Admission: RE | Admit: 2022-06-30 | Discharge: 2022-06-30 | Disposition: A | Discharge status: Home / Self Care | Payer: Medicare PPO | Source: Ambulatory Visit | Attending: Radiation Oncology | Admitting: Radiation Oncology

## 2022-06-30 DIAGNOSIS — Z510 Encounter for antineoplastic radiation therapy: Secondary | ICD-10-CM | POA: Diagnosis not present

## 2022-06-30 DIAGNOSIS — C678 Malignant neoplasm of overlapping sites of bladder: Secondary | ICD-10-CM

## 2022-06-30 LAB — RAD ONC ARIA SESSION SUMMARY
Course Elapsed Days: 30
Plan Fractions Treated to Date: 11
Plan Prescribed Dose Per Fraction: 1.8 Gy
Plan Total Fractions Prescribed: 25
Plan Total Prescribed Dose: 45 Gy
Reference Point Dosage Given to Date: 19.8 Gy
Reference Point Session Dosage Given: 1.8 Gy
Session Number: 22

## 2022-06-30 NOTE — Progress Notes (Signed)
48 hour extravasation assessment performed by this RN today. IV site was similar in appearance to yesterday's visit with mild bruising noted. No redness, swelling, warmth or tenderness. Patient still denies pain at IV site.  Patient's facility caregiver mentioned to this RN that patient has continued worsening BLLE and asked if there is anything to help. This RN advised for patient to continue wearing compression socks, elevate her feet as much as tolerated and limit sodium intake. RN also advised to monitor for shortness of breath and chest pain. Sherol Dade, PA-C, to assess patient.

## 2022-07-01 ENCOUNTER — Inpatient Hospital Stay: Payer: Medicare PPO

## 2022-07-01 ENCOUNTER — Ambulatory Visit
Admission: RE | Admit: 2022-07-01 | Discharge: 2022-07-01 | Disposition: A | Discharge status: Home / Self Care | Payer: Medicare PPO | Source: Ambulatory Visit | Attending: Radiation Oncology | Admitting: Radiation Oncology

## 2022-07-01 ENCOUNTER — Other Ambulatory Visit: Payer: Self-pay

## 2022-07-01 VITALS — BP 177/75 | HR 100 | Temp 98.4°F | Resp 20

## 2022-07-01 DIAGNOSIS — Z510 Encounter for antineoplastic radiation therapy: Secondary | ICD-10-CM | POA: Diagnosis not present

## 2022-07-01 DIAGNOSIS — C678 Malignant neoplasm of overlapping sites of bladder: Secondary | ICD-10-CM

## 2022-07-01 LAB — RAD ONC ARIA SESSION SUMMARY
Course Elapsed Days: 31
Plan Fractions Treated to Date: 12
Plan Prescribed Dose Per Fraction: 1.8 Gy
Plan Total Fractions Prescribed: 25
Plan Total Prescribed Dose: 45 Gy
Reference Point Dosage Given to Date: 21.6 Gy
Reference Point Session Dosage Given: 1.8 Gy
Session Number: 23

## 2022-07-01 MED ORDER — SODIUM CHLORIDE 0.9 % IV SOLN: Freq: Once | INTRAVENOUS | Status: DC

## 2022-07-01 MED ORDER — SODIUM CHLORIDE 0.9 % IV SOLN
27.0000 mg/m2 | Freq: Once | INTRAVENOUS | Status: AC
  Administered 2022-07-01: 46 mg via INTRAVENOUS
  Filled 2022-07-01: qty 1.21

## 2022-07-01 MED ORDER — SODIUM CHLORIDE 0.9 % IV SOLN: Freq: Once | INTRAVENOUS | Status: AC

## 2022-07-01 MED ORDER — PROCHLORPERAZINE MALEATE 10 MG PO TABS
10.0000 mg | ORAL_TABLET | Freq: Once | ORAL | Status: AC
  Administered 2022-07-01: 10 mg via ORAL
  Filled 2022-07-01: qty 1

## 2022-07-01 NOTE — Patient Instructions (Signed)
[facility]  Discharge Instructions: Thank you for choosing [facility] to provide your oncology and hematology care.   If you have a lab appointment with the [facility], please go directly to the [facility] and check in at the registration area.   Wear comfortable clothing and clothing appropriate for easy access to any Portacath or PICC line.   We strive to give you quality time with your provider. You may need to reschedule your appointment if you arrive late (15 or more minutes).  Arriving late affects you and other patients whose appointments are after yours.  Also, if you miss three or more appointments without notifying the office, you may be dismissed from the clinic at the provider's discretion.      For prescription refill requests, have your pharmacy contact our office and allow 72 hours for refills to be completed.    Today you received the following chemotherapy and/or immunotherapy agents Gemzar      To help prevent nausea and vomiting after your treatment, we encourage you to take your nausea medication as directed.  BELOW ARE SYMPTOMS THAT SHOULD BE REPORTED IMMEDIATELY: *FEVER GREATER THAN 100.4 F (38 C) OR HIGHER *CHILLS OR SWEATING *NAUSEA AND VOMITING THAT IS NOT CONTROLLED WITH YOUR NAUSEA MEDICATION *UNUSUAL SHORTNESS OF BREATH *UNUSUAL BRUISING OR BLEEDING *URINARY PROBLEMS (pain or burning when urinating, or frequent urination) *BOWEL PROBLEMS (unusual diarrhea, constipation, pain near the anus) TENDERNESS IN MOUTH AND THROAT WITH OR WITHOUT PRESENCE OF ULCERS (sore throat, sores in mouth, or a toothache) UNUSUAL RASH, SWELLING OR PAIN  UNUSUAL VAGINAL DISCHARGE OR ITCHING   Items with * indicate a potential emergency and should be followed up as soon as possible or go to the Emergency Department if any problems should occur.  Please show the CHEMOTHERAPY ALERT CARD or IMMUNOTHERAPY ALERT CARD at check-in  to the Emergency Department and triage nurse.  Should you have questions after your visit or need to cancel or reschedule your appointment, please contact [facility]  Dept: [phone]  and follow the prompts.  Office hours are 8:00 a.m. to 4:30 p.m. Monday - Friday. Please note that voicemails left after 4:00 p.m. may not be returned until the following business day.  We are closed weekends and major holidays. You have access to a nurse at all times for urgent questions. Please call the main number to the clinic Dept: [phone] and follow the prompts.   For any non-urgent questions, you may also contact your provider using MyChart. We now offer e-Visits for anyone 59 and older to request care online for non-urgent symptoms. For details visit [email or web address].   Also download the MyChart app! Go to the app store, search "MyChart", open the app, select [facility], and log in with your MyChart username and password.

## 2022-07-02 ENCOUNTER — Ambulatory Visit
Admission: RE | Admit: 2022-07-02 | Discharge: 2022-07-02 | Disposition: A | Discharge status: Home / Self Care | Payer: Medicare PPO | Source: Ambulatory Visit | Attending: Radiation Oncology | Admitting: Radiation Oncology

## 2022-07-02 ENCOUNTER — Other Ambulatory Visit: Payer: Self-pay

## 2022-07-02 DIAGNOSIS — Z510 Encounter for antineoplastic radiation therapy: Secondary | ICD-10-CM | POA: Diagnosis not present

## 2022-07-02 LAB — RAD ONC ARIA SESSION SUMMARY
Course Elapsed Days: 32
Plan Fractions Treated to Date: 13
Plan Prescribed Dose Per Fraction: 1.8 Gy
Plan Total Fractions Prescribed: 25
Plan Total Prescribed Dose: 45 Gy
Reference Point Dosage Given to Date: 23.4 Gy
Reference Point Session Dosage Given: 1.8 Gy
Session Number: 24

## 2022-07-05 ENCOUNTER — Inpatient Hospital Stay: Payer: Medicare PPO

## 2022-07-05 ENCOUNTER — Other Ambulatory Visit: Payer: Self-pay

## 2022-07-05 ENCOUNTER — Inpatient Hospital Stay (HOSPITAL_BASED_OUTPATIENT_CLINIC_OR_DEPARTMENT_OTHER): Payer: Medicare PPO | Admitting: Internal Medicine

## 2022-07-05 ENCOUNTER — Ambulatory Visit
Admission: RE | Admit: 2022-07-05 | Discharge: 2022-07-05 | Disposition: A | Discharge status: Home / Self Care | Payer: Medicare PPO | Source: Ambulatory Visit | Attending: Radiation Oncology | Admitting: Radiation Oncology

## 2022-07-05 VITALS — BP 147/77 | HR 75 | Temp 97.8°F | Resp 19 | Ht 65.0 in | Wt 154.4 lb

## 2022-07-05 VITALS — BP 177/75 | HR 74 | Temp 97.5°F | Resp 18

## 2022-07-05 DIAGNOSIS — Z510 Encounter for antineoplastic radiation therapy: Secondary | ICD-10-CM | POA: Diagnosis not present

## 2022-07-05 DIAGNOSIS — C678 Malignant neoplasm of overlapping sites of bladder: Secondary | ICD-10-CM | POA: Diagnosis not present

## 2022-07-05 LAB — CMP (CANCER CENTER ONLY)
ALT: 23 U/L (ref 0–44)
AST: 28 U/L (ref 15–41)
Albumin: 3.5 g/dL (ref 3.5–5.0)
Alkaline Phosphatase: 64 U/L (ref 38–126)
Anion gap: 6 (ref 5–15)
BUN: 18 mg/dL (ref 8–23)
CO2: 31 mmol/L (ref 22–32)
Calcium: 9.2 mg/dL (ref 8.9–10.3)
Chloride: 106 mmol/L (ref 98–111)
Creatinine: 1 mg/dL (ref 0.44–1.00)
GFR, Estimated: 57 mL/min — ABNORMAL LOW (ref >60)
Glucose, Bld: 94 mg/dL (ref 70–99)
Potassium: 4.3 mmol/L (ref 3.5–5.1)
Sodium: 143 mmol/L (ref 135–145)
Total Bilirubin: 0.3 mg/dL (ref 0.3–1.2)
Total Protein: 6.4 g/dL — ABNORMAL LOW (ref 6.5–8.1)

## 2022-07-05 LAB — RAD ONC ARIA SESSION SUMMARY
Course Elapsed Days: 35
Plan Fractions Treated to Date: 14
Plan Prescribed Dose Per Fraction: 1.8 Gy
Plan Total Fractions Prescribed: 25
Plan Total Prescribed Dose: 45 Gy
Reference Point Dosage Given to Date: 25.2 Gy
Reference Point Session Dosage Given: 1.8 Gy
Session Number: 25

## 2022-07-05 LAB — CBC WITH DIFFERENTIAL (CANCER CENTER ONLY)
Abs Immature Granulocytes: 0.1 10*3/uL — ABNORMAL HIGH (ref 0.00–0.07)
Basophils Absolute: 0 10*3/uL (ref 0.0–0.1)
Basophils Relative: 1 %
Eosinophils Absolute: 0.7 10*3/uL — ABNORMAL HIGH (ref 0.0–0.5)
Eosinophils Relative: 12 %
HCT: 28 % — ABNORMAL LOW (ref 36.0–46.0)
Hemoglobin: 9.9 g/dL — ABNORMAL LOW (ref 12.0–15.0)
Immature Granulocytes: 2 %
Lymphocytes Relative: 4 %
Lymphs Abs: 0.3 10*3/uL — ABNORMAL LOW (ref 0.7–4.0)
MCH: 31.3 pg (ref 26.0–34.0)
MCHC: 35.4 g/dL (ref 30.0–36.0)
MCV: 88.6 fL (ref 80.0–100.0)
Monocytes Absolute: 0.5 10*3/uL (ref 0.1–1.0)
Monocytes Relative: 8 %
Neutro Abs: 4.3 10*3/uL (ref 1.7–7.7)
Neutrophils Relative %: 73 %
Platelet Count: 305 10*3/uL (ref 150–400)
RBC: 3.16 MIL/uL — ABNORMAL LOW (ref 3.87–5.11)
RDW: 21.1 % — ABNORMAL HIGH (ref 11.5–15.5)
WBC Count: 5.9 10*3/uL (ref 4.0–10.5)
nRBC: 0.8 % — ABNORMAL HIGH (ref 0.0–0.2)

## 2022-07-05 MED ORDER — SODIUM CHLORIDE 0.9 % IV SOLN: Freq: Once | INTRAVENOUS | Status: AC

## 2022-07-05 MED ORDER — PROCHLORPERAZINE MALEATE 10 MG PO TABS
10.0000 mg | ORAL_TABLET | Freq: Once | ORAL | Status: AC
  Administered 2022-07-05: 10 mg via ORAL
  Filled 2022-07-05: qty 1

## 2022-07-05 MED ORDER — SODIUM CHLORIDE 0.9 % IV SOLN
27.0000 mg/m2 | Freq: Once | INTRAVENOUS | Status: AC
  Administered 2022-07-05: 46 mg via INTRAVENOUS
  Filled 2022-07-05: qty 1.21

## 2022-07-05 NOTE — Progress Notes (Signed)
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 The patient is not a good surgical candidate for bladder resection.   PRIOR THERAPY: None  CURRENT THERAPY: Current chemoradiation with gemcitabine at a lower dose of 27 Mg/M2 on days 1, 4, 8, 11, 15, 18, 22 and 25 for the first cycle and similar treatment for the second cycle until day 18.  First dose January 12/2022.  INTERVAL HISTORY: Traci Powell 82 y.o. female returns to the clinic today for follow-up visit accompanied by her caregiver.  The patient has been tolerating her treatment with concurrent chemoradiation fairly well.  She denied having any significant chest pain, shortness of breath, cough or hemoptysis.  She denied having any fever or chills.  She has no nausea, vomiting, diarrhea or constipation.  She has no headache or visual changes.  She denied having any significant weight loss or night sweats.  She is here today for evaluation before starting day 15 of cycle #2.  MEDICAL HISTORY: Past Medical History:  Diagnosis Date   Alzheimer's dementia [facility])    Cervical stenosis of spine    CKD (chronic kidney disease)    Gait disorder    Insomnia    Personal history of COVID-19 08/2020    ALLERGIES:  is allergic to iodinated contrast media.  MEDICATIONS:  Current Outpatient Medications  Medication Sig Dispense Refill   acetaminophen (TYLENOL) 500 MG tablet Take 1,000 mg by mouth in the morning and at bedtime.     amLODipine (NORVASC) 5 MG tablet Take 5 mg by mouth daily.     diclofenac Sodium (VOLTAREN) 1 % GEL Apply 3 g topically in the morning and at bedtime.     divalproex (DEPAKOTE ER) 250 MG 24 hr tablet Take 250 mg by mouth 2 (two) times daily.     docusate sodium (COLACE) 100 MG capsule Take  100 mg by mouth at bedtime.     hydrALAZINE (APRESOLINE) 10 MG tablet Take 10 mg by mouth 2 (two) times daily.     hydrOXYzine (ATARAX) 10 MG tablet Take 10 mg by mouth at bedtime.     lisinopril (ZESTRIL) 20 MG tablet Take 20 mg by mouth daily.     LORazepam (ATIVAN) 0.5 MG tablet Take 0.5 mg by mouth as needed for anxiety. When going to outside appointments     melatonin 3 MG TABS tablet Take 10 mg by mouth at bedtime.     memantine (NAMENDA) 10 MG tablet Take 10 mg by mouth 2 (two) times daily.     Menthol, Topical Analgesic, (BIOFREEZE) 10 % CREA Apply 1 Application topically in the morning and at bedtime. Apply to knees     ondansetron (ZOFRAN) 8 MG tablet Take 8 mg by mouth every 8 (eight) hours as needed for nausea or vomiting.     pravastatin (PRAVACHOL) 10 MG tablet Take 10 mg by mouth daily.     traMADol (ULTRAM) 50 MG tablet Take 25 mg by mouth 4 (four) times daily.     traZODone (DESYREL) 50 MG tablet Take 50 mg by mouth at bedtime.     No current facility-administered medications for this visit.   Facility-Administered Medications Ordered in Other Visits  Medication Dose Route Frequency Provider Last Rate Last Admin  0.9 %  sodium chloride infusion   Intravenous Once Curt Bears, MD        SURGICAL HISTORY:  Past Surgical History:  Procedure Laterality Date   ABDOMINAL HYSTERECTOMY     COLON SURGERY  2007   resection W/ ileostomy and reversal   TRANSURETHRAL RESECTION OF BLADDER TUMOR WITH MITOMYCIN-C N/A 04/22/2022   Procedure: TRANSURETHRAL RESECTION OF BLADDER TUMOR;  Surgeon: Irine Seal, MD;  Location: [facility];  Service: Urology;  Laterality: N/A;    REVIEW OF SYSTEMS:  A comprehensive review of systems was negative except for: Constitutional: positive for fatigue   PHYSICAL EXAMINATION: General appearance: alert, cooperative, fatigued, and no distress Head: Normocephalic, without obvious abnormality, atraumatic Neck: no adenopathy, no JVD, supple, symmetrical,  trachea midline, and thyroid not enlarged, symmetric, no tenderness/mass/nodules Lymph nodes: Cervical, supraclavicular, and axillary nodes normal. Resp: clear to auscultation bilaterally Back: symmetric, no curvature. ROM normal. No CVA tenderness. Cardio: regular rate and rhythm, S1, S2 normal, no murmur, click, rub or gallop GI: soft, non-tender; bowel sounds normal; no masses,  no organomegaly Extremities: extremities normal, atraumatic, no cyanosis or edema  ECOG PERFORMANCE STATUS: 1 - Symptomatic but completely ambulatory  Blood pressure (!) 147/77, pulse 75, temperature 97.8 F (36.6 C), temperature source Temporal, resp. rate 19, height 5' 5"$  (1.651 m), weight 154 lb 6.4 oz (70 kg), SpO2 94 %.  LABORATORY DATA: Lab Results  Component Value Date   WBC 5.9 07/05/2022   HGB 9.9 (L) 07/05/2022   HCT 28.0 (L) 07/05/2022   MCV 88.6 07/05/2022   PLT 305 07/05/2022      Chemistry      Component Value Date/Time   NA 142 06/28/2022 1024   K 4.6 06/28/2022 1024   CL 107 06/28/2022 1024   CO2 28 06/28/2022 1024   BUN 23 06/28/2022 1024   CREATININE 1.04 (H) 06/28/2022 1024      Component Value Date/Time   CALCIUM 9.8 06/28/2022 1024   ALKPHOS 60 06/28/2022 1024   AST 29 06/28/2022 1024   ALT 28 06/28/2022 1024   BILITOT 0.4 06/28/2022 1024       RADIOGRAPHIC STUDIES: No results found.  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 The patient is not a good surgical candidate for bladder resection.  She is currently undergoing a course of concurrent chemoradiation with gemcitabine at a lower dose of 27 Mg/M2 on days 1, 4, 8, 11, 15, 18, 22 and 25 for the first cycle and similar treatment for the second cycle until day 18.  First dose January 12/2022.  She is status post 1 cycle and currently  undergoing cycle #2. I recommended for the patient to proceed with day 15 of cycle #2 today as planned. She will also have 2 more treatment of chemotherapy concurrent with radiation which is expected to be completed on July 20, 2022. I will see her back for follow-up visit in 2 weeks for evaluation and recommendation regarding her condition. She was advised to call immediately if she has any other concerning symptoms in the interval. The patient voices understanding of current disease status and treatment options and is in agreement with the current care plan.  All questions were answered. The patient knows to call the clinic with any problems, questions or concerns. We can certainly see the patient much  sooner if necessary.  The total time spent in the appointment was 20 minutes.  Disclaimer: This note was dictated with voice recognition software. Similar sounding words can inadvertently be transcribed and may not be corrected upon review.

## 2022-07-05 NOTE — Progress Notes (Signed)
This RN to assess patient in infusion room for one week extravasation follow up. IV site noted to have very mild bruising, less pronounced and smaller than previous visits. No redness, swelling or tenderness noted. Patient declines pain at IV site.

## 2022-07-05 NOTE — Patient Instructions (Signed)
[facility]  Discharge Instructions: Thank you for choosing [facility] to provide your oncology and hematology care.   If you have a lab appointment with the [facility], please go directly to the [facility] and check in at the registration area.   Wear comfortable clothing and clothing appropriate for easy access to any Portacath or PICC line.   We strive to give you quality time with your provider. You may need to reschedule your appointment if you arrive late (15 or more minutes).  Arriving late affects you and other patients whose appointments are after yours.  Also, if you miss three or more appointments without notifying the office, you may be dismissed from the clinic at the provider's discretion.      For prescription refill requests, have your pharmacy contact our office and allow 72 hours for refills to be completed.    Today you received the following chemotherapy and/or immunotherapy agents Gemzar.   To help prevent nausea and vomiting after your treatment, we encourage you to take your nausea medication as directed.  BELOW ARE SYMPTOMS THAT SHOULD BE REPORTED IMMEDIATELY: *FEVER GREATER THAN 100.4 F (38 C) OR HIGHER *CHILLS OR SWEATING *NAUSEA AND VOMITING THAT IS NOT CONTROLLED WITH YOUR NAUSEA MEDICATION *UNUSUAL SHORTNESS OF BREATH *UNUSUAL BRUISING OR BLEEDING *URINARY PROBLEMS (pain or burning when urinating, or frequent urination) *BOWEL PROBLEMS (unusual diarrhea, constipation, pain near the anus) TENDERNESS IN MOUTH AND THROAT WITH OR WITHOUT PRESENCE OF ULCERS (sore throat, sores in mouth, or a toothache) UNUSUAL RASH, SWELLING OR PAIN  UNUSUAL VAGINAL DISCHARGE OR ITCHING   Items with * indicate a potential emergency and should be followed up as soon as possible or go to the Emergency Department if any problems should occur.  Please show the CHEMOTHERAPY ALERT CARD or IMMUNOTHERAPY ALERT CARD at check-in to  the Emergency Department and triage nurse.  Should you have questions after your visit or need to cancel or reschedule your appointment, please contact [facility]  Dept: [phone]  and follow the prompts.  Office hours are 8:00 a.m. to 4:30 p.m. Monday - Friday. Please note that voicemails left after 4:00 p.m. may not be returned until the following business day.  We are closed weekends and major holidays. You have access to a nurse at all times for urgent questions. Please call the main number to the clinic Dept: [phone] and follow the prompts.   For any non-urgent questions, you may also contact your provider using MyChart. We now offer e-Visits for anyone 31 and older to request care online for non-urgent symptoms. For details visit [email or web address].   Also download the MyChart app! Go to the app store, search "MyChart", open the app, select [facility], and log in with your MyChart username and password.

## 2022-07-06 ENCOUNTER — Ambulatory Visit
Admission: RE | Admit: 2022-07-06 | Discharge: 2022-07-06 | Disposition: A | Discharge status: Home / Self Care | Payer: Medicare PPO | Source: Ambulatory Visit | Attending: Radiation Oncology | Admitting: Radiation Oncology

## 2022-07-06 ENCOUNTER — Other Ambulatory Visit: Payer: Self-pay

## 2022-07-06 DIAGNOSIS — Z510 Encounter for antineoplastic radiation therapy: Secondary | ICD-10-CM | POA: Diagnosis not present

## 2022-07-06 LAB — RAD ONC ARIA SESSION SUMMARY
Course Elapsed Days: 36
Plan Fractions Treated to Date: 15
Plan Prescribed Dose Per Fraction: 1.8 Gy
Plan Total Fractions Prescribed: 25
Plan Total Prescribed Dose: 45 Gy
Reference Point Dosage Given to Date: 27 Gy
Reference Point Session Dosage Given: 1.8 Gy
Session Number: 26

## 2022-07-07 ENCOUNTER — Ambulatory Visit
Admission: RE | Admit: 2022-07-07 | Discharge: 2022-07-07 | Disposition: A | Discharge status: Home / Self Care | Payer: Medicare PPO | Source: Ambulatory Visit | Attending: Radiation Oncology | Admitting: Radiation Oncology

## 2022-07-07 ENCOUNTER — Other Ambulatory Visit: Payer: Self-pay

## 2022-07-07 ENCOUNTER — Telehealth: Payer: Self-pay | Admitting: Physician Assistant

## 2022-07-07 DIAGNOSIS — Z510 Encounter for antineoplastic radiation therapy: Secondary | ICD-10-CM | POA: Diagnosis not present

## 2022-07-07 LAB — RAD ONC ARIA SESSION SUMMARY
Course Elapsed Days: 37
Plan Fractions Treated to Date: 16
Plan Prescribed Dose Per Fraction: 1.8 Gy
Plan Total Fractions Prescribed: 25
Plan Total Prescribed Dose: 45 Gy
Reference Point Dosage Given to Date: 28.8 Gy
Reference Point Session Dosage Given: 1.8 Gy
Session Number: 27

## 2022-07-07 NOTE — Telephone Encounter (Signed)
Scheduled per 02/20 los, patient has been called and voicemail was left.

## 2022-07-08 ENCOUNTER — Other Ambulatory Visit: Payer: Self-pay

## 2022-07-08 ENCOUNTER — Inpatient Hospital Stay: Payer: Medicare PPO

## 2022-07-08 ENCOUNTER — Ambulatory Visit
Admission: RE | Admit: 2022-07-08 | Discharge: 2022-07-08 | Disposition: A | Discharge status: Home / Self Care | Payer: Medicare PPO | Source: Ambulatory Visit | Attending: Radiation Oncology | Admitting: Radiation Oncology

## 2022-07-08 VITALS — BP 155/76 | HR 78 | Temp 98.7°F | Resp 18

## 2022-07-08 DIAGNOSIS — Z510 Encounter for antineoplastic radiation therapy: Secondary | ICD-10-CM | POA: Diagnosis not present

## 2022-07-08 DIAGNOSIS — C678 Malignant neoplasm of overlapping sites of bladder: Secondary | ICD-10-CM

## 2022-07-08 LAB — CMP (CANCER CENTER ONLY)
ALT: 39 U/L (ref 0–44)
AST: 41 U/L (ref 15–41)
Albumin: 3.6 g/dL (ref 3.5–5.0)
Alkaline Phosphatase: 77 U/L (ref 38–126)
Anion gap: 7 (ref 5–15)
BUN: 23 mg/dL (ref 8–23)
CO2: 29 mmol/L (ref 22–32)
Calcium: 9 mg/dL (ref 8.9–10.3)
Chloride: 107 mmol/L (ref 98–111)
Creatinine: 1.03 mg/dL — ABNORMAL HIGH (ref 0.44–1.00)
GFR, Estimated: 55 mL/min — ABNORMAL LOW
Glucose, Bld: 95 mg/dL (ref 70–99)
Potassium: 4.1 mmol/L (ref 3.5–5.1)
Sodium: 143 mmol/L (ref 135–145)
Total Bilirubin: 0.3 mg/dL (ref 0.3–1.2)
Total Protein: 6.6 g/dL (ref 6.5–8.1)

## 2022-07-08 LAB — RAD ONC ARIA SESSION SUMMARY
Course Elapsed Days: 38
Plan Fractions Treated to Date: 17
Plan Prescribed Dose Per Fraction: 1.8 Gy
Plan Total Fractions Prescribed: 25
Plan Total Prescribed Dose: 45 Gy
Reference Point Dosage Given to Date: 30.6 Gy
Reference Point Session Dosage Given: 1.8 Gy
Session Number: 28

## 2022-07-08 LAB — CBC WITH DIFFERENTIAL (CANCER CENTER ONLY)
Abs Immature Granulocytes: 0.11 10*3/uL — ABNORMAL HIGH (ref 0.00–0.07)
Basophils Absolute: 0 10*3/uL (ref 0.0–0.1)
Basophils Relative: 0 %
Eosinophils Absolute: 0.7 10*3/uL — ABNORMAL HIGH (ref 0.0–0.5)
Eosinophils Relative: 13 %
HCT: 26.9 % — ABNORMAL LOW (ref 36.0–46.0)
Hemoglobin: 9.9 g/dL — ABNORMAL LOW (ref 12.0–15.0)
Immature Granulocytes: 2 %
Lymphocytes Relative: 6 %
Lymphs Abs: 0.3 10*3/uL — ABNORMAL LOW (ref 0.7–4.0)
MCH: 32.6 pg (ref 26.0–34.0)
MCHC: 36.8 g/dL — ABNORMAL HIGH (ref 30.0–36.0)
MCV: 88.5 fL (ref 80.0–100.0)
Monocytes Absolute: 0.6 10*3/uL (ref 0.1–1.0)
Monocytes Relative: 11 %
Neutro Abs: 3.4 10*3/uL (ref 1.7–7.7)
Neutrophils Relative %: 68 %
Platelet Count: 317 10*3/uL (ref 150–400)
RBC: 3.04 MIL/uL — ABNORMAL LOW (ref 3.87–5.11)
RDW: 21.5 % — ABNORMAL HIGH (ref 11.5–15.5)
WBC Count: 5.1 10*3/uL (ref 4.0–10.5)
nRBC: 1.4 % — ABNORMAL HIGH (ref 0.0–0.2)

## 2022-07-08 MED ORDER — PROCHLORPERAZINE MALEATE 10 MG PO TABS
10.0000 mg | ORAL_TABLET | Freq: Once | ORAL | Status: AC
  Administered 2022-07-08: 10 mg via ORAL
  Filled 2022-07-08: qty 1

## 2022-07-08 MED ORDER — SODIUM CHLORIDE 0.9 % IV SOLN
27.0000 mg/m2 | Freq: Once | INTRAVENOUS | Status: AC
  Administered 2022-07-08: 46 mg via INTRAVENOUS
  Filled 2022-07-08: qty 1.21

## 2022-07-08 MED ORDER — SODIUM CHLORIDE 0.9 % IV SOLN: Freq: Once | INTRAVENOUS | Status: AC

## 2022-07-08 NOTE — Patient Instructions (Signed)
[facility]  Discharge Instructions: Thank you for choosing [facility] to provide your oncology and hematology care.   If you have a lab appointment with the [facility], please go directly to the [facility] and check in at the registration area.   Wear comfortable clothing and clothing appropriate for easy access to any Portacath or PICC line.   We strive to give you quality time with your provider. You may need to reschedule your appointment if you arrive late (15 or more minutes).  Arriving late affects you and other patients whose appointments are after yours.  Also, if you miss three or more appointments without notifying the office, you may be dismissed from the clinic at the provider's discretion.      For prescription refill requests, have your pharmacy contact our office and allow 72 hours for refills to be completed.    Today you received the following chemotherapy and/or immunotherapy agents: Gemzar.   To help prevent nausea and vomiting after your treatment, we encourage you to take your nausea medication as directed.  BELOW ARE SYMPTOMS THAT SHOULD BE REPORTED IMMEDIATELY: *FEVER GREATER THAN 100.4 F (38 C) OR HIGHER *CHILLS OR SWEATING *NAUSEA AND VOMITING THAT IS NOT CONTROLLED WITH YOUR NAUSEA MEDICATION *UNUSUAL SHORTNESS OF BREATH *UNUSUAL BRUISING OR BLEEDING *URINARY PROBLEMS (pain or burning when urinating, or frequent urination) *BOWEL PROBLEMS (unusual diarrhea, constipation, pain near the anus) TENDERNESS IN MOUTH AND THROAT WITH OR WITHOUT PRESENCE OF ULCERS (sore throat, sores in mouth, or a toothache) UNUSUAL RASH, SWELLING OR PAIN  UNUSUAL VAGINAL DISCHARGE OR ITCHING   Items with * indicate a potential emergency and should be followed up as soon as possible or go to the Emergency Department if any problems should occur.  Please show the CHEMOTHERAPY ALERT CARD or IMMUNOTHERAPY ALERT CARD at check-in  to the Emergency Department and triage nurse.  Should you have questions after your visit or need to cancel or reschedule your appointment, please contact [facility]  Dept: [phone]  and follow the prompts.  Office hours are 8:00 a.m. to 4:30 p.m. Monday - Friday. Please note that voicemails left after 4:00 p.m. may not be returned until the following business day.  We are closed weekends and major holidays. You have access to a nurse at all times for urgent questions. Please call the main number to the clinic Dept: [phone] and follow the prompts.   For any non-urgent questions, you may also contact your provider using MyChart. We now offer e-Visits for anyone 37 and older to request care online for non-urgent symptoms. For details visit [email or web address].   Also download the MyChart app! Go to the app store, search "MyChart", open the app, select [facility], and log in with your MyChart username and password.

## 2022-07-09 ENCOUNTER — Ambulatory Visit
Admission: RE | Admit: 2022-07-09 | Discharge: 2022-07-09 | Disposition: A | Discharge status: Home / Self Care | Payer: Medicare PPO | Source: Ambulatory Visit | Attending: Radiation Oncology | Admitting: Radiation Oncology

## 2022-07-09 ENCOUNTER — Other Ambulatory Visit: Payer: Self-pay

## 2022-07-09 DIAGNOSIS — Z510 Encounter for antineoplastic radiation therapy: Secondary | ICD-10-CM | POA: Diagnosis not present

## 2022-07-09 LAB — RAD ONC ARIA SESSION SUMMARY
Course Elapsed Days: 39
Plan Fractions Treated to Date: 18
Plan Prescribed Dose Per Fraction: 1.8 Gy
Plan Total Fractions Prescribed: 25
Plan Total Prescribed Dose: 45 Gy
Reference Point Dosage Given to Date: 32.4 Gy
Reference Point Session Dosage Given: 1.8 Gy
Session Number: 29

## 2022-07-12 ENCOUNTER — Other Ambulatory Visit: Payer: Self-pay

## 2022-07-12 ENCOUNTER — Inpatient Hospital Stay: Payer: Medicare PPO

## 2022-07-12 ENCOUNTER — Ambulatory Visit
Admission: RE | Admit: 2022-07-12 | Discharge: 2022-07-12 | Disposition: A | Discharge status: Home / Self Care | Payer: Medicare PPO | Source: Ambulatory Visit | Attending: Radiation Oncology | Admitting: Radiation Oncology

## 2022-07-12 VITALS — BP 163/66 | HR 75 | Temp 98.9°F | Resp 116

## 2022-07-12 DIAGNOSIS — C678 Malignant neoplasm of overlapping sites of bladder: Secondary | ICD-10-CM

## 2022-07-12 DIAGNOSIS — Z510 Encounter for antineoplastic radiation therapy: Secondary | ICD-10-CM | POA: Diagnosis not present

## 2022-07-12 LAB — RAD ONC ARIA SESSION SUMMARY
Course Elapsed Days: 42
Plan Fractions Treated to Date: 19
Plan Prescribed Dose Per Fraction: 1.8 Gy
Plan Total Fractions Prescribed: 25
Plan Total Prescribed Dose: 45 Gy
Reference Point Dosage Given to Date: 34.2 Gy
Reference Point Session Dosage Given: 1.8 Gy
Session Number: 30

## 2022-07-12 LAB — CBC WITH DIFFERENTIAL (CANCER CENTER ONLY)
Abs Immature Granulocytes: 0.25 10*3/uL — ABNORMAL HIGH (ref 0.00–0.07)
Basophils Absolute: 0 10*3/uL (ref 0.0–0.1)
Basophils Relative: 1 %
Eosinophils Absolute: 0.6 10*3/uL — ABNORMAL HIGH (ref 0.0–0.5)
Eosinophils Relative: 10 %
HCT: 24.5 % — ABNORMAL LOW (ref 36.0–46.0)
Hemoglobin: 8.9 g/dL — ABNORMAL LOW (ref 12.0–15.0)
Immature Granulocytes: 4 %
Lymphocytes Relative: 3 %
Lymphs Abs: 0.2 10*3/uL — ABNORMAL LOW (ref 0.7–4.0)
MCH: 32.2 pg (ref 26.0–34.0)
MCHC: 36.3 g/dL — ABNORMAL HIGH (ref 30.0–36.0)
MCV: 88.8 fL (ref 80.0–100.0)
Monocytes Absolute: 0.6 10*3/uL (ref 0.1–1.0)
Monocytes Relative: 10 %
Neutro Abs: 4.3 10*3/uL (ref 1.7–7.7)
Neutrophils Relative %: 72 %
Platelet Count: 262 10*3/uL (ref 150–400)
RBC: 2.76 MIL/uL — ABNORMAL LOW (ref 3.87–5.11)
RDW: 22.2 % — ABNORMAL HIGH (ref 11.5–15.5)
WBC Count: 5.9 10*3/uL (ref 4.0–10.5)
nRBC: 2.7 % — ABNORMAL HIGH (ref 0.0–0.2)

## 2022-07-12 LAB — CMP (CANCER CENTER ONLY)
ALT: 42 U/L (ref 0–44)
AST: 42 U/L — ABNORMAL HIGH (ref 15–41)
Albumin: 3.5 g/dL (ref 3.5–5.0)
Alkaline Phosphatase: 69 U/L (ref 38–126)
Anion gap: 7 (ref 5–15)
BUN: 35 mg/dL — ABNORMAL HIGH (ref 8–23)
CO2: 29 mmol/L (ref 22–32)
Calcium: 8.9 mg/dL (ref 8.9–10.3)
Chloride: 107 mmol/L (ref 98–111)
Creatinine: 1.03 mg/dL — ABNORMAL HIGH (ref 0.44–1.00)
GFR, Estimated: 55 mL/min — ABNORMAL LOW (ref >60)
Glucose, Bld: 96 mg/dL (ref 70–99)
Potassium: 4.2 mmol/L (ref 3.5–5.1)
Sodium: 143 mmol/L (ref 135–145)
Total Bilirubin: 0.4 mg/dL (ref 0.3–1.2)
Total Protein: 6.5 g/dL (ref 6.5–8.1)

## 2022-07-12 MED ORDER — PROCHLORPERAZINE MALEATE 10 MG PO TABS
10.0000 mg | ORAL_TABLET | Freq: Once | ORAL | Status: AC
  Administered 2022-07-12: 10 mg via ORAL
  Filled 2022-07-12: qty 1

## 2022-07-12 MED ORDER — SODIUM CHLORIDE 0.9 % IV SOLN: Freq: Once | INTRAVENOUS | Status: AC

## 2022-07-12 MED ORDER — SODIUM CHLORIDE 0.9 % IV SOLN
27.0000 mg/m2 | Freq: Once | INTRAVENOUS | Status: AC
  Administered 2022-07-12: 46 mg via INTRAVENOUS
  Filled 2022-07-12: qty 1.21

## 2022-07-12 MED ORDER — SODIUM CHLORIDE 0.9 % IV SOLN: Freq: Once | INTRAVENOUS | Status: DC

## 2022-07-12 NOTE — Patient Instructions (Signed)
[facility]  Discharge Instructions: Thank you for choosing [facility] to provide your oncology and hematology care.   If you have a lab appointment with the Cancer Center, please go directly to the Cancer Center and check in at the registration area.   Wear comfortable clothing and clothing appropriate for easy access to any Portacath or PICC line.   We strive to give you quality time with your provider. You may need to reschedule your appointment if you arrive late (15 or more minutes).  Arriving late affects you and other patients whose appointments are after yours.  Also, if you miss three or more appointments without notifying the office, you may be dismissed from the clinic at the provider's discretion.      For prescription refill requests, have your pharmacy contact our office and allow 72 hours for refills to be completed.    Today you received the following chemotherapy and/or immunotherapy agents: Gemcitabine.       To help prevent nausea and vomiting after your treatment, we encourage you to take your nausea medication as directed.  BELOW ARE SYMPTOMS THAT SHOULD BE REPORTED IMMEDIATELY: *FEVER GREATER THAN 100.4 F (38 C) OR HIGHER *CHILLS OR SWEATING *NAUSEA AND VOMITING THAT IS NOT CONTROLLED WITH YOUR NAUSEA MEDICATION *UNUSUAL SHORTNESS OF BREATH *UNUSUAL BRUISING OR BLEEDING *URINARY PROBLEMS (pain or burning when urinating, or frequent urination) *BOWEL PROBLEMS (unusual diarrhea, constipation, pain near the anus) TENDERNESS IN MOUTH AND THROAT WITH OR WITHOUT PRESENCE OF ULCERS (sore throat, sores in mouth, or a toothache) UNUSUAL RASH, SWELLING OR PAIN  UNUSUAL VAGINAL DISCHARGE OR ITCHING   Items with * indicate a potential emergency and should be followed up as soon as possible or go to the Emergency Department if any problems should occur.  Please show the CHEMOTHERAPY ALERT CARD or IMMUNOTHERAPY ALERT CARD at  check-in to the Emergency Department and triage nurse.  Should you have questions after your visit or need to cancel or reschedule your appointment, please contact [facility]  Dept: [phone]  and follow the prompts.  Office hours are 8:00 a.m. to 4:30 p.m. Monday - Friday. Please note that voicemails left after 4:00 p.m. may not be returned until the following business day.  We are closed weekends and major holidays. You have access to a nurse at all times for urgent questions. Please call the main number to the clinic Dept: [phone] and follow the prompts.   For any non-urgent questions, you may also contact your provider using MyChart. We now offer e-Visits for anyone 18 and older to request care online for non-urgent symptoms. For details visit [email or web address].   Also download the MyChart app! Go to the app store, search "MyChart", open the app, select [facility], and log in with your MyChart username and password.   

## 2022-07-13 ENCOUNTER — Other Ambulatory Visit: Payer: Self-pay

## 2022-07-13 ENCOUNTER — Ambulatory Visit: Payer: Medicare PPO

## 2022-07-13 ENCOUNTER — Ambulatory Visit
Admission: RE | Admit: 2022-07-13 | Discharge: 2022-07-13 | Disposition: A | Discharge status: Home / Self Care | Payer: Medicare PPO | Source: Ambulatory Visit | Attending: Radiation Oncology | Admitting: Radiation Oncology

## 2022-07-13 DIAGNOSIS — Z510 Encounter for antineoplastic radiation therapy: Secondary | ICD-10-CM | POA: Diagnosis not present

## 2022-07-13 LAB — RAD ONC ARIA SESSION SUMMARY
Course Elapsed Days: 43
Plan Fractions Treated to Date: 20
Plan Prescribed Dose Per Fraction: 1.8 Gy
Plan Total Fractions Prescribed: 25
Plan Total Prescribed Dose: 45 Gy
Reference Point Dosage Given to Date: 36 Gy
Reference Point Session Dosage Given: 1.8 Gy
Session Number: 31

## 2022-07-14 ENCOUNTER — Ambulatory Visit
Admission: RE | Admit: 2022-07-14 | Discharge: 2022-07-14 | Disposition: A | Discharge status: Home / Self Care | Payer: Medicare PPO | Source: Ambulatory Visit | Attending: Radiation Oncology | Admitting: Radiation Oncology

## 2022-07-14 ENCOUNTER — Other Ambulatory Visit: Payer: Self-pay

## 2022-07-14 DIAGNOSIS — Z510 Encounter for antineoplastic radiation therapy: Secondary | ICD-10-CM | POA: Diagnosis not present

## 2022-07-14 LAB — RAD ONC ARIA SESSION SUMMARY
Course Elapsed Days: 44
Plan Fractions Treated to Date: 21
Plan Prescribed Dose Per Fraction: 1.8 Gy
Plan Total Fractions Prescribed: 25
Plan Total Prescribed Dose: 45 Gy
Reference Point Dosage Given to Date: 37.8 Gy
Reference Point Session Dosage Given: 1.8 Gy
Session Number: 32

## 2022-07-15 ENCOUNTER — Other Ambulatory Visit: Payer: Self-pay

## 2022-07-15 ENCOUNTER — Ambulatory Visit
Admission: RE | Admit: 2022-07-15 | Discharge: 2022-07-15 | Disposition: A | Discharge status: Home / Self Care | Payer: Medicare PPO | Source: Ambulatory Visit | Attending: Radiation Oncology | Admitting: Radiation Oncology

## 2022-07-15 DIAGNOSIS — Z510 Encounter for antineoplastic radiation therapy: Secondary | ICD-10-CM | POA: Diagnosis not present

## 2022-07-15 LAB — RAD ONC ARIA SESSION SUMMARY
Course Elapsed Days: 45
Plan Fractions Treated to Date: 22
Plan Prescribed Dose Per Fraction: 1.8 Gy
Plan Total Fractions Prescribed: 25
Plan Total Prescribed Dose: 45 Gy
Reference Point Dosage Given to Date: 39.6 Gy
Reference Point Session Dosage Given: 1.8 Gy
Session Number: 33

## 2022-07-16 ENCOUNTER — Ambulatory Visit
Admission: RE | Admit: 2022-07-16 | Discharge: 2022-07-16 | Disposition: A | Discharge status: Home / Self Care | Payer: Medicare PPO | Source: Ambulatory Visit | Attending: Radiation Oncology | Admitting: Radiation Oncology

## 2022-07-16 ENCOUNTER — Ambulatory Visit: Payer: Medicare PPO

## 2022-07-16 ENCOUNTER — Other Ambulatory Visit: Payer: Self-pay

## 2022-07-16 DIAGNOSIS — C678 Malignant neoplasm of overlapping sites of bladder: Secondary | ICD-10-CM | POA: Diagnosis not present

## 2022-07-16 DIAGNOSIS — I129 Hypertensive chronic kidney disease with stage 1 through stage 4 chronic kidney disease, or unspecified chronic kidney disease: Secondary | ICD-10-CM | POA: Diagnosis not present

## 2022-07-16 DIAGNOSIS — F02818 Dementia in other diseases classified elsewhere, unspecified severity, with other behavioral disturbance: Secondary | ICD-10-CM | POA: Insufficient documentation

## 2022-07-16 DIAGNOSIS — Z9079 Acquired absence of other genital organ(s): Secondary | ICD-10-CM | POA: Diagnosis not present

## 2022-07-16 DIAGNOSIS — Z5111 Encounter for antineoplastic chemotherapy: Secondary | ICD-10-CM | POA: Insufficient documentation

## 2022-07-16 DIAGNOSIS — Z8616 Personal history of COVID-19: Secondary | ICD-10-CM | POA: Diagnosis not present

## 2022-07-16 DIAGNOSIS — Z510 Encounter for antineoplastic radiation therapy: Secondary | ICD-10-CM | POA: Insufficient documentation

## 2022-07-16 DIAGNOSIS — Z87891 Personal history of nicotine dependence: Secondary | ICD-10-CM | POA: Diagnosis not present

## 2022-07-16 LAB — RAD ONC ARIA SESSION SUMMARY
Course Elapsed Days: 46
Plan Fractions Treated to Date: 23
Plan Prescribed Dose Per Fraction: 1.8 Gy
Plan Total Fractions Prescribed: 25
Plan Total Prescribed Dose: 45 Gy
Reference Point Dosage Given to Date: 41.4 Gy
Reference Point Session Dosage Given: 1.8 Gy
Session Number: 34

## 2022-07-19 ENCOUNTER — Ambulatory Visit
Admission: RE | Admit: 2022-07-19 | Discharge: 2022-07-19 | Disposition: A | Discharge status: Home / Self Care | Payer: Medicare PPO | Source: Ambulatory Visit | Attending: Radiation Oncology | Admitting: Radiation Oncology

## 2022-07-19 ENCOUNTER — Other Ambulatory Visit: Payer: Self-pay

## 2022-07-19 ENCOUNTER — Ambulatory Visit: Payer: Medicare PPO

## 2022-07-19 DIAGNOSIS — Z5111 Encounter for antineoplastic chemotherapy: Secondary | ICD-10-CM | POA: Diagnosis not present

## 2022-07-19 LAB — RAD ONC ARIA SESSION SUMMARY
Course Elapsed Days: 49
Plan Fractions Treated to Date: 24
Plan Prescribed Dose Per Fraction: 1.8 Gy
Plan Total Fractions Prescribed: 25
Plan Total Prescribed Dose: 45 Gy
Reference Point Dosage Given to Date: 43.2 Gy
Reference Point Session Dosage Given: 1.8 Gy
Session Number: 35

## 2022-07-20 ENCOUNTER — Other Ambulatory Visit: Payer: Self-pay

## 2022-07-20 ENCOUNTER — Ambulatory Visit
Admission: RE | Admit: 2022-07-20 | Discharge: 2022-07-20 | Disposition: A | Discharge status: Home / Self Care | Payer: Medicare PPO | Source: Ambulatory Visit | Attending: Radiation Oncology | Admitting: Radiation Oncology

## 2022-07-20 ENCOUNTER — Encounter: Payer: Self-pay | Admitting: Urology

## 2022-07-20 DIAGNOSIS — Z5111 Encounter for antineoplastic chemotherapy: Secondary | ICD-10-CM | POA: Diagnosis not present

## 2022-07-20 LAB — RAD ONC ARIA SESSION SUMMARY
Course Elapsed Days: 50
Plan Fractions Treated to Date: 25
Plan Prescribed Dose Per Fraction: 1.8 Gy
Plan Total Fractions Prescribed: 25
Plan Total Prescribed Dose: 45 Gy
Reference Point Dosage Given to Date: 45 Gy
Reference Point Session Dosage Given: 1.8 Gy
Session Number: 36

## 2022-07-20 NOTE — Progress Notes (Signed)
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 The patient is not a good surgical candidate for bladder resection.   PRIOR THERAPY: Current chemoradiation with gemcitabine at a lower dose of 27 Mg/M2 on days 1, 4, 8, 11, 15, 18, 22 and 25 for the first cycle and similar treatment for the second cycle until day 18.  Last dose on 07/12/22     CURRENT THERAPY: Observation   INTERVAL HISTORY: Traci Powell 82 y.o. female returns to the clinic today for a follow-up visit accompanied. The patient is currently a resident of a skilled nursing facility secondary to her advanced dementia.  The patient recently completed a course of chemoradiation.  She tolerated well without any concerning adverse side effects.  Her last day of radiation was earlier this week on 07/20/2022.  The patient denies any complaints today.  Denies any fever, chills, night sweats, or unexplained weight loss. She does not have a good appetite though. Denies any nausea, vomiting, diarrhea, or constipation.  Denies any chest pain, shortness of breath, cough, or hemoptysis.  Denies any abdominal pain, dysuria, or hematuria.  Denies any unusual back pain.  She previously saw Dr. Jeffie Pollock from urology. She is here today for evaluation and to discuss the next steps in her care.    MEDICAL HISTORY: Past Medical History:  Diagnosis Date   Alzheimer's dementia [facility])    Cervical stenosis of spine    CKD (chronic kidney disease)    Gait disorder    Insomnia    Personal history of COVID-19 08/2020    ALLERGIES:  is allergic to iodinated contrast media.  MEDICATIONS:  Current Outpatient Medications  Medication Sig Dispense Refill   acetaminophen (TYLENOL) 500 MG tablet Take 1,000 mg by mouth in the morning and at bedtime.     amLODipine (NORVASC) 5 MG tablet Take 5  mg by mouth daily.     diclofenac Sodium (VOLTAREN) 1 % GEL Apply 3 g topically in the morning and at bedtime.     divalproex (DEPAKOTE ER) 250 MG 24 hr tablet Take 250 mg by mouth 2 (two) times daily.     docusate sodium (COLACE) 100 MG capsule Take 100 mg by mouth at bedtime.     hydrALAZINE (APRESOLINE) 10 MG tablet Take 10 mg by mouth 2 (two) times daily.     hydrOXYzine (ATARAX) 10 MG tablet Take 10 mg by mouth at bedtime.     lisinopril (ZESTRIL) 20 MG tablet Take 20 mg by mouth daily.     LORazepam (ATIVAN) 0.5 MG tablet Take 0.5 mg by mouth as needed for anxiety. When going to outside appointments     melatonin 3 MG TABS tablet Take 10 mg by mouth at bedtime.     memantine (NAMENDA) 10 MG tablet Take 10 mg by mouth 2 (two) times daily.     Menthol, Topical Analgesic, (BIOFREEZE) 10 % CREA Apply 1 Application topically in the morning and at bedtime. Apply to knees     ondansetron (ZOFRAN) 8 MG tablet Take 8 mg by mouth every 8 (eight) hours as needed for nausea or vomiting.     pravastatin (PRAVACHOL) 10 MG tablet Take 10 mg by mouth daily.     traMADol (ULTRAM) 50 MG tablet Take 25 mg by mouth 4 (four) times daily.  traZODone (DESYREL) 50 MG tablet Take 50 mg by mouth at bedtime.     No current facility-administered medications for this visit.   Facility-Administered Medications Ordered in Other Visits  Medication Dose Route Frequency Provider Last Rate Last Admin   0.9 %  sodium chloride infusion   Intravenous Once Curt Bears, MD        SURGICAL HISTORY:  Past Surgical History:  Procedure Laterality Date   ABDOMINAL HYSTERECTOMY     COLON SURGERY  2007   resection W/ ileostomy and reversal   TRANSURETHRAL RESECTION OF BLADDER TUMOR WITH MITOMYCIN-C N/A 04/22/2022   Procedure: TRANSURETHRAL RESECTION OF BLADDER TUMOR;  Surgeon: Irine Seal, MD;  Location: [facility];  Service: Urology;  Laterality: N/A;    REVIEW OF SYSTEMS:   Review of Systems  Constitutional: Positive  for poor appetite. Negative for chills, fatigue, fever and unexpected weight change.  HENT: Negative for mouth sores, nosebleeds, sore throat and trouble swallowing.   Eyes: Negative for eye problems and icterus.  Respiratory: Negative for cough, hemoptysis, shortness of breath and wheezing.   Cardiovascular: Negative for chest pain and leg swelling.  Gastrointestinal: Negative for abdominal pain, constipation, diarrhea, nausea and vomiting.  Genitourinary: Negative for bladder incontinence, difficulty urinating, dysuria, frequency and hematuria.   Musculoskeletal: Negative for back pain, gait problem, neck pain and neck stiffness.  Skin: Negative for itching and rash.  Neurological: Negative for dizziness, extremity weakness, gait problem, headaches, light-headedness and seizures.  Hematological: Negative for adenopathy. Does not bruise/bleed easily.  Psychiatric/Behavioral: Negative for confusion, depression and sleep disturbance. The patient is not nervous/anxious.     PHYSICAL EXAMINATION:  There were no vitals taken for this visit.  ECOG PERFORMANCE STATUS: 2  Physical Exam  Constitutional: Oriented to person but not to place or time and well-developed, well-nourished, and in no distress.  HENT:  Head: Normocephalic and atraumatic.  Mouth/Throat: Oropharynx is clear and moist. No oropharyngeal exudate.  Eyes: Conjunctivae are normal. Right eye exhibits no discharge. Left eye exhibits no discharge. No scleral icterus.  Neck: Normal range of motion. Neck supple.  Cardiovascular: Normal rate, regular rhythm, normal heart sounds and intact distal pulses.   Pulmonary/Chest: Effort normal and breath sounds normal. No respiratory distress. No wheezes. No rales.  Abdominal: Soft. Bowel sounds are normal. Exhibits no distension and no mass. There is no tenderness.  Musculoskeletal: Normal range of motion. Exhibits no edema.  Lymphadenopathy:    No cervical adenopathy.  Neurological: Alert  and oriented to person, place, and time. Exhibits muscle wasting. Examined in the wheelchair although she is ambulatory. Skin: Skin is warm and dry. No rash noted. Not diaphoretic. No erythema. No pallor.  Psychiatric: Mood and judgment normal. Positive for memory deficits.  Vitals reviewed.  LABORATORY DATA: Lab Results  Component Value Date   WBC 5.9 07/12/2022   HGB 8.9 (L) 07/12/2022   HCT 24.5 (L) 07/12/2022   MCV 88.8 07/12/2022   PLT 262 07/12/2022      Chemistry      Component Value Date/Time   NA 143 07/12/2022 1320   K 4.2 07/12/2022 1320   CL 107 07/12/2022 1320   CO2 29 07/12/2022 1320   BUN 35 (H) 07/12/2022 1320   CREATININE 1.03 (H) 07/12/2022 1320      Component Value Date/Time   CALCIUM 8.9 07/12/2022 1320   ALKPHOS 69 07/12/2022 1320   AST Traci (H) 07/12/2022 1320   ALT Traci 07/12/2022 1320   BILITOT 0.4 07/12/2022 1320  RADIOGRAPHIC STUDIES:  No results found.   ASSESSMENT/PLAN:  This is a very pleasant 75 year Powell African-American female with unresectable stage II (T2, N0, M0) urothelial carcinoma of the bladder.  This was diagnosed in December 2023.  She is status post TURP with resection of the tumor.  The patient was not a good candidate for bladder resection.   She is currently undergoing a course of concurrent chemoradiation with gemcitabine at a low dose of 27 mg/m on days 1, 4, 8, 11, 15, 18, 22, and 25.  Her first dose of treatment was on 05/24/2022. She completed this on 07/12/22.  Her last day radiation was on 07/20/2022.   The patient was seen with Dr. Julien Nordmann today.  Dr. Julien Nordmann discussed the next steps in her care including re-referral back to Dr. Jeffie Pollock for consideration of cystoscopy.   Dr. Julien Nordmann recommend seeing her back for follow-up visit after having a repeat scan in 3 months of the chest, abdomen, and pelvis.  We will order without contrast due to her iodine allergy.  The patient was advised to call immediately if she has any  concerning symptoms in the interval. The patient voices understanding of current disease status and treatment options and is in agreement with the current care plan. All questions were answered. The patient knows to call the clinic with any problems, questions or concerns. We can certainly see the patient much sooner if necessary    No orders of the defined types were placed in this encounter.     Santiel Topper L Shontae Rosiles, PA-C 07/20/22  ADDENDUM: Hematology/Oncology Attending: I had a face-to-face encounter with the patient today.  I reviewed her records, lab and recommended her care plan.  This is a very pleasant 82 years Powell African-American female with a stage II urothelial carcinoma of the bladder diagnosed in December 2023 status post TURP with resection of the tumor and she was not a good candidate for bladder resection.  The patient underwent a course of concurrent chemoradiation with low-dose gemcitabine.  She has been tolerating this treatment well with no concerning adverse effects. I recommended for the patient to see Dr. Jeffie Pollock for discussion of the next option of her treatment and repeat cystoscopy.  Would also consider her for repeat imaging studies in around 3 months. The patient was advised to call immediately if she has any other concerning symptoms in the interval. The total time spent in the appointment was 30 minutes. Disclaimer: This note was dictated with voice recognition software. Similar sounding words can inadvertently be transcribed and may be missed upon review. Eilleen Kempf, MD

## 2022-07-21 ENCOUNTER — Other Ambulatory Visit: Payer: Self-pay

## 2022-07-22 ENCOUNTER — Inpatient Hospital Stay: Payer: Medicare PPO | Attending: Internal Medicine | Admitting: Physician Assistant

## 2022-07-22 ENCOUNTER — Other Ambulatory Visit: Payer: Self-pay

## 2022-07-22 ENCOUNTER — Inpatient Hospital Stay: Payer: Medicare PPO

## 2022-07-22 VITALS — BP 151/67 | HR 73 | Temp 98.3°F | Resp 14 | Wt 146.0 lb

## 2022-07-22 DIAGNOSIS — Z923 Personal history of irradiation: Secondary | ICD-10-CM | POA: Diagnosis not present

## 2022-07-22 DIAGNOSIS — Z9221 Personal history of antineoplastic chemotherapy: Secondary | ICD-10-CM | POA: Insufficient documentation

## 2022-07-22 DIAGNOSIS — Z8616 Personal history of COVID-19: Secondary | ICD-10-CM | POA: Insufficient documentation

## 2022-07-22 DIAGNOSIS — C678 Malignant neoplasm of overlapping sites of bladder: Secondary | ICD-10-CM

## 2022-07-22 DIAGNOSIS — F02B0 Dementia in other diseases classified elsewhere, moderate, without behavioral disturbance, psychotic disturbance, mood disturbance, and anxiety: Secondary | ICD-10-CM | POA: Diagnosis not present

## 2022-07-22 DIAGNOSIS — C679 Malignant neoplasm of bladder, unspecified: Secondary | ICD-10-CM | POA: Insufficient documentation

## 2022-07-22 LAB — CBC WITH DIFFERENTIAL/PLATELET
Abs Immature Granulocytes: 0.15 10*3/uL — ABNORMAL HIGH (ref 0.00–0.07)
Basophils Absolute: 0 10*3/uL (ref 0.0–0.1)
Basophils Relative: 1 %
Eosinophils Absolute: 0.6 10*3/uL — ABNORMAL HIGH (ref 0.0–0.5)
Eosinophils Relative: 9 %
HCT: 23.8 % — ABNORMAL LOW (ref 36.0–46.0)
Hemoglobin: 8.5 g/dL — ABNORMAL LOW (ref 12.0–15.0)
Immature Granulocytes: 2 %
Lymphocytes Relative: 21 %
Lymphs Abs: 1.3 10*3/uL (ref 0.7–4.0)
MCH: 32.6 pg (ref 26.0–34.0)
MCHC: 35.7 g/dL (ref 30.0–36.0)
MCV: 91.2 fL (ref 80.0–100.0)
Monocytes Absolute: 1.6 10*3/uL — ABNORMAL HIGH (ref 0.1–1.0)
Monocytes Relative: 26 %
Neutro Abs: 2.5 10*3/uL (ref 1.7–7.7)
Neutrophils Relative %: 41 %
Platelets: 223 10*3/uL (ref 150–400)
RBC: 2.61 MIL/uL — ABNORMAL LOW (ref 3.87–5.11)
RDW: 25 % — ABNORMAL HIGH (ref 11.5–15.5)
WBC: 6.2 10*3/uL (ref 4.0–10.5)
nRBC: 1 % — ABNORMAL HIGH (ref 0.0–0.2)

## 2022-07-22 LAB — COMPREHENSIVE METABOLIC PANEL
ALT: 20 U/L (ref 0–44)
AST: 26 U/L (ref 15–41)
Albumin: 3.2 g/dL — ABNORMAL LOW (ref 3.5–5.0)
Alkaline Phosphatase: 77 U/L (ref 38–126)
Anion gap: 6 (ref 5–15)
BUN: 23 mg/dL (ref 8–23)
CO2: 29 mmol/L (ref 22–32)
Calcium: 8.7 mg/dL — ABNORMAL LOW (ref 8.9–10.3)
Chloride: 110 mmol/L (ref 98–111)
Creatinine, Ser: 1.05 mg/dL — ABNORMAL HIGH (ref 0.44–1.00)
GFR, Estimated: 54 mL/min — ABNORMAL LOW (ref >60)
Glucose, Bld: 95 mg/dL (ref 70–99)
Potassium: 3.4 mmol/L — ABNORMAL LOW (ref 3.5–5.1)
Sodium: 145 mmol/L (ref 135–145)
Total Bilirubin: 0.3 mg/dL (ref 0.3–1.2)
Total Protein: 5.8 g/dL — ABNORMAL LOW (ref 6.5–8.1)

## 2022-07-24 ENCOUNTER — Encounter: Payer: Self-pay | Admitting: Internal Medicine

## 2022-08-17 ENCOUNTER — Other Ambulatory Visit: Payer: Self-pay

## 2022-08-24 ENCOUNTER — Encounter: Payer: Self-pay | Admitting: Internal Medicine

## 2022-08-24 ENCOUNTER — Ambulatory Visit
Admission: RE | Admit: 2022-08-24 | Discharge: 2022-08-24 | Disposition: A | Discharge status: Home / Self Care | Payer: Medicare PPO | Source: Ambulatory Visit | Attending: Radiation Oncology | Admitting: Radiation Oncology

## 2022-08-24 NOTE — Progress Notes (Addendum)
  Radiation Oncology         [phone]) [phone] ________________________________  Name: Traci Powell MRN: [id number]  Date of Service: 08/24/2022  DOB: 1941-02-02  Post Treatment Telephone Note for BLADDER  Diagnosis:  Stage II (T2, N0, M0) urothelial carcinoma of the bladder (as documented in provider EOT note)   The patient was not available for call today. No voicemail option.  The patient has a scheduled follow up w/ her urologist Dr. Annabell Howells on 10/25/2022 at [facility], and was encouraged to call if she develops concerns or questions regarding radiation.   Ruel Favors, LPN

## 2022-08-24 NOTE — Progress Notes (Signed)
                                                                                                                                                             Patient Name: Traci Powell MRN: [id number] DOB: 10/21/1940 Referring Physician: Bjorn Pippin (Profile Not Attached) Date of Service: 07/20/2022 [facility], [place]                                                        End Of Treatment Note  Diagnoses: C67.8-Malignant neoplasm of overlapping sites of bladder  Cancer Staging: 82 year old female with muscle invasive bladder cancer presenting with gross hematuria.   Intent: Curative  Radiation Treatment Dates: 05/31/2022 through 07/20/2022 Site Technique Total Dose (Gy) Dose per Fx (Gy) Completed Fx Beam Energies  Bladder: Bladder_Bst 3D 19.8/19.8 1.8 11/11 15X  Bladder: Bladder_Pelvis 3D 45/45 1.8 25/25 10X, 15X   Narrative: The patient tolerated radiation therapy relatively well with only modest fatigue.  Plan: The patient will receive a call in about one month from the radiation oncology department. She will continue follow up with her urologist, Dr. Annabell Howells, as well.  ------------------------------------------------   Margaretmary Dys, MD [facility]  Radiation Oncology Direct Dial: [phone]  Fax: [phone] [email or web address]  Skype  LinkedIn

## 2022-10-22 ENCOUNTER — Ambulatory Visit (HOSPITAL_COMMUNITY)
Admission: RE | Admit: 2022-10-22 | Discharge: 2022-10-22 | Disposition: A | Discharge status: Home / Self Care | Payer: Medicare PPO | Source: Ambulatory Visit | Attending: Physician Assistant | Admitting: Physician Assistant

## 2022-10-22 ENCOUNTER — Other Ambulatory Visit: Payer: Self-pay

## 2022-10-22 ENCOUNTER — Other Ambulatory Visit: Payer: Self-pay | Admitting: Physician Assistant

## 2022-10-22 ENCOUNTER — Inpatient Hospital Stay: Payer: Medicare PPO | Attending: Internal Medicine

## 2022-10-22 DIAGNOSIS — I959 Hypotension, unspecified: Secondary | ICD-10-CM | POA: Diagnosis not present

## 2022-10-22 DIAGNOSIS — Z9079 Acquired absence of other genital organ(s): Secondary | ICD-10-CM | POA: Insufficient documentation

## 2022-10-22 DIAGNOSIS — F02818 Dementia in other diseases classified elsewhere, unspecified severity, with other behavioral disturbance: Secondary | ICD-10-CM | POA: Insufficient documentation

## 2022-10-22 DIAGNOSIS — Z8551 Personal history of malignant neoplasm of bladder: Secondary | ICD-10-CM | POA: Diagnosis present

## 2022-10-22 DIAGNOSIS — C678 Malignant neoplasm of overlapping sites of bladder: Secondary | ICD-10-CM | POA: Insufficient documentation

## 2022-10-22 DIAGNOSIS — Z79899 Other long term (current) drug therapy: Secondary | ICD-10-CM | POA: Insufficient documentation

## 2022-10-22 DIAGNOSIS — Z923 Personal history of irradiation: Secondary | ICD-10-CM | POA: Diagnosis not present

## 2022-10-22 DIAGNOSIS — Z90710 Acquired absence of both cervix and uterus: Secondary | ICD-10-CM | POA: Diagnosis not present

## 2022-10-22 DIAGNOSIS — G4700 Insomnia, unspecified: Secondary | ICD-10-CM | POA: Insufficient documentation

## 2022-10-22 DIAGNOSIS — Z9221 Personal history of antineoplastic chemotherapy: Secondary | ICD-10-CM | POA: Diagnosis not present

## 2022-10-22 LAB — CBC WITH DIFFERENTIAL (CANCER CENTER ONLY)
Abs Immature Granulocytes: 0.01 10*3/uL (ref 0.00–0.07)
Basophils Absolute: 0 10*3/uL (ref 0.0–0.1)
Basophils Relative: 0 %
Eosinophils Absolute: 0 10*3/uL (ref 0.0–0.5)
Eosinophils Relative: 1 %
HCT: 32.5 % — ABNORMAL LOW (ref 36.0–46.0)
Hemoglobin: 11.3 g/dL — ABNORMAL LOW (ref 12.0–15.0)
Immature Granulocytes: 0 %
Lymphocytes Relative: 27 %
Lymphs Abs: 1.3 10*3/uL (ref 0.7–4.0)
MCH: 31.1 pg (ref 26.0–34.0)
MCHC: 34.8 g/dL (ref 30.0–36.0)
MCV: 89.5 fL (ref 80.0–100.0)
Monocytes Absolute: 0.7 10*3/uL (ref 0.1–1.0)
Monocytes Relative: 14 %
Neutro Abs: 3 10*3/uL (ref 1.7–7.7)
Neutrophils Relative %: 58 %
Platelet Count: 249 10*3/uL (ref 150–400)
RBC: 3.63 MIL/uL — ABNORMAL LOW (ref 3.87–5.11)
RDW: 15.5 % (ref 11.5–15.5)
WBC Count: 5.1 10*3/uL (ref 4.0–10.5)
nRBC: 0 % (ref 0.0–0.2)

## 2022-10-22 LAB — CMP (CANCER CENTER ONLY)
ALT: 6 U/L (ref 0–44)
AST: 11 U/L — ABNORMAL LOW (ref 15–41)
Albumin: 3.9 g/dL (ref 3.5–5.0)
Alkaline Phosphatase: 63 U/L (ref 38–126)
Anion gap: 6 (ref 5–15)
BUN: 23 mg/dL (ref 8–23)
CO2: 32 mmol/L (ref 22–32)
Calcium: 10.5 mg/dL — ABNORMAL HIGH (ref 8.9–10.3)
Chloride: 109 mmol/L (ref 98–111)
Creatinine: 0.98 mg/dL (ref 0.44–1.00)
GFR, Estimated: 59 mL/min — ABNORMAL LOW (ref >60)
Glucose, Bld: 100 mg/dL — ABNORMAL HIGH (ref 70–99)
Potassium: 5 mmol/L (ref 3.5–5.1)
Sodium: 147 mmol/L — ABNORMAL HIGH (ref 135–145)
Total Bilirubin: 0.2 mg/dL — ABNORMAL LOW (ref 0.3–1.2)
Total Protein: 7.1 g/dL (ref 6.5–8.1)

## 2022-10-22 NOTE — Progress Notes (Unsigned)
[facility] OFFICE PROGRESS NOTE  Karna Dupes, MD 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 The patient is not a good surgical candidate for bladder resection.   PRIOR THERAPY: Current chemoradiation with gemcitabine at a lower dose of 27 Mg/M2 on days 1, 4, 8, 11, 15, 18, 22 and 25 for the first cycle and similar treatment for the second cycle until day 18.  Last dose on 07/12/22     CURRENT THERAPY:  Observation   INTERVAL HISTORY: Traci Powell 82 y.o. female returns to the clinic today for a follow-up visit accompanied by a nursing staff member of her skilled nursing facility.  The patient lives in a skilled nursing facility secondary to her advanced dementia.  In summary the patient was diagnosed in December 2023 with bladder cancer.  She underwent chemotherapy and radiation which was completed in February 2024.  She is supposed to undergo a cystoscopy with Dr. Annabell Howells soon per my last communication in March where he recommended cystoscopy in 3 months. The patient and the caregiver are unsure if she has seen him or has an upcoming appointment with him.   The patient's blood pressure is elevated in the clinic today.  She is asymptomatic and denies any lightheadedness, chest pain, or vision changes today.  We called the facility who stated her blood pressure was low this morning at 101/67.  Therefore she has not received any of her antihypertensives today.  She is prescribed lisinopril, Norvasc, and hydralazine.  Since last being seen she denies any major changes in her health.  She denies any fever, chills, night sweats, or unexplained weight loss.  She reports good appetite.  Denies any nausea, vomiting, diarrhea, or constipation.  Denies any chest pain, shortness of breath, cough, or hemoptysis.  Denies any abdominal pain,  dysuria, or hematuria.  Denies any unusual back pain.  She recently had a restaging CT scan performed.  She is here today for evaluation to review her scan results.   MEDICAL HISTORY: Past Medical History:  Diagnosis Date   Alzheimer's dementia [facility])    Cervical stenosis of spine    CKD (chronic kidney disease)    Gait disorder    Insomnia    Personal history of COVID-19 08/2020    ALLERGIES:  is allergic to iodinated contrast media.  MEDICATIONS:  Current Outpatient Medications  Medication Sig Dispense Refill   acetaminophen (TYLENOL) 500 MG tablet Take 1,000 mg by mouth in the morning and at bedtime.     amLODipine (NORVASC) 5 MG tablet Take 5 mg by mouth daily.     diclofenac Sodium (VOLTAREN) 1 % GEL Apply 3 g topically in the morning and at bedtime.     divalproex (DEPAKOTE ER) 250 MG 24 hr tablet Take 250 mg by mouth 2 (two) times daily.     docusate sodium (COLACE) 100 MG capsule Take 100 mg by mouth at bedtime.     hydrALAZINE (APRESOLINE) 10 MG tablet Take 10 mg by mouth 2 (two) times daily.     hydrOXYzine (ATARAX) 10 MG tablet Take 10 mg by mouth at bedtime.     lisinopril (ZESTRIL) 20 MG tablet Take 20 mg by mouth daily.     LORazepam (ATIVAN) 0.5 MG tablet Take 0.5 mg by mouth as needed for anxiety. When going to outside appointments  melatonin 3 MG TABS tablet Take 10 mg by mouth at bedtime.     memantine (NAMENDA) 10 MG tablet Take 10 mg by mouth 2 (two) times daily.     Menthol, Topical Analgesic, (BIOFREEZE) 10 % CREA Apply 1 Application topically in the morning and at bedtime. Apply to knees     ondansetron (ZOFRAN) 8 MG tablet Take 8 mg by mouth every 8 (eight) hours as needed for nausea or vomiting.     pravastatin (PRAVACHOL) 10 MG tablet Take 10 mg by mouth daily.     traMADol (ULTRAM) 50 MG tablet Take 25 mg by mouth 4 (four) times daily.     traZODone (DESYREL) 50 MG tablet Take 50 mg by mouth at bedtime.     No current facility-administered medications  for this visit.   Facility-Administered Medications Ordered in Other Visits  Medication Dose Route Frequency Provider Last Rate Last Admin   0.9 %  sodium chloride infusion   Intravenous Once Si Gaul, MD        SURGICAL HISTORY:  Past Surgical History:  Procedure Laterality Date   ABDOMINAL HYSTERECTOMY     COLON SURGERY  2007   resection W/ ileostomy and reversal   TRANSURETHRAL RESECTION OF BLADDER TUMOR WITH MITOMYCIN-C N/A 04/22/2022   Procedure: TRANSURETHRAL RESECTION OF BLADDER TUMOR;  Surgeon: Bjorn Pippin, MD;  Location: [facility];  Service: Urology;  Laterality: N/A;    REVIEW OF SYSTEMS:   Review of Systems  Constitutional: Negative for appetite change, chills, fatigue, fever and unexpected weight change.  HENT:   Negative for mouth sores, nosebleeds, sore throat and trouble swallowing.   Eyes: Negative for eye problems and icterus.  Respiratory: Negative for cough, hemoptysis, shortness of breath and wheezing.   Cardiovascular: Negative for chest pain and leg swelling.  Gastrointestinal: Negative for abdominal pain, constipation, diarrhea, nausea and vomiting.  Genitourinary: Negative for bladder incontinence, difficulty urinating, dysuria, frequency and hematuria.   Musculoskeletal: Negative for back pain, gait problem, neck pain and neck stiffness.  Skin: Negative for itching and rash.  Neurological: Negative for dizziness, extremity weakness, gait problem, headaches, light-headedness and seizures.  Hematological: Negative for adenopathy. Does not bruise/bleed easily.  Psychiatric/Behavioral: Positive for dementia. Negative for confusion, depression and sleep disturbance. The patient is not nervous/anxious.     PHYSICAL EXAMINATION:  There were no vitals taken for this visit.  ECOG PERFORMANCE STATUS: 2  Physical Exam  Constitutional: Oriented to person but not to place or time and well-developed, well-nourished, and in no distress.  HENT:  Head:  Normocephalic and atraumatic.  Mouth/Throat: Oropharynx is clear and moist. No oropharyngeal exudate.  Eyes: Conjunctivae are normal. Right eye exhibits no discharge. Left eye exhibits no discharge. No scleral icterus.  Neck: Normal range of motion. Neck supple.  Cardiovascular: Normal rate, regular rhythm, normal heart sounds and intact distal pulses.   Pulmonary/Chest: Effort normal and breath sounds normal. No respiratory distress. No wheezes. No rales.  Abdominal: Soft. Bowel sounds are normal. Exhibits no distension and no mass. There is no tenderness.  Musculoskeletal: Normal range of motion. Exhibits no edema.  Lymphadenopathy:    No cervical adenopathy.  Neurological: Alert and oriented to person, place, and time. Exhibits muscle wasting. Examined in the wheelchair although she is ambulatory. Skin: Skin is warm and dry. No rash noted. Not diaphoretic. No erythema. No pallor.  Psychiatric: Mood and judgment normal. Positive for memory deficits.  Vitals reviewed.  LABORATORY DATA: Lab Results  Component Value Date  WBC 5.1 10/22/2022   HGB 11.3 (L) 10/22/2022   HCT 32.5 (L) 10/22/2022   MCV 89.5 10/22/2022   PLT 249 10/22/2022      Chemistry      Component Value Date/Time   NA 147 (H) 10/22/2022 1015   K 5.0 10/22/2022 1015   CL 109 10/22/2022 1015   CO2 32 10/22/2022 1015   BUN 23 10/22/2022 1015   CREATININE 0.98 10/22/2022 1015      Component Value Date/Time   CALCIUM 10.5 (H) 10/22/2022 1015   ALKPHOS 63 10/22/2022 1015   AST 11 (L) 10/22/2022 1015   ALT 6 10/22/2022 1015   BILITOT 0.2 (L) 10/22/2022 1015       RADIOGRAPHIC STUDIES:  No results found.   ASSESSMENT/PLAN:  This is a very pleasant 82 year old African-American female with unresectable stage II (T2, N0, M0) urothelial carcinoma of the bladder.  This was diagnosed in December 2023.  She is status post TURP with resection of the tumor.  The patient was not a good candidate for bladder  resection.   She completed a course of concurrent chemoradiation with gemcitabine at a low dose of 27 mg/m on days 1, 4, 8, 11, 15, 18, 22, and 25.  Her first dose of treatment was on 05/24/2022. She completed this on 07/12/22.  Her last day radiation was on 07/20/2022.   Patient recently had a restaging CT scan performed.  Patient was seen with Dr. Arbutus Ped today.  Dr. Arbutus Ped personally and independently reviewed the scan discussed results with the patient today.  The scan showed no evidence of thoracic metastases or any evidence of bladder cancer recurrence or metastatic disease.   The patient does not need any systemic treatment at this time.  Given she have her 57-month follow-up cystoscopy with Dr. Annabell Howells if this has not been done so already.   We will see her back for follow-up visit in 6 months with a repeat CT scan of the abdomen and pelvis.  We will order this without IV contrast due to her iodine allergy.  The patient was given 0.1 mg of clonidine while in the clinic today.  We called the facility and she did not take any of her antihypertensives today due to her blood pressure being low this morning.  They are aware of her current blood pressure and will monitor her closely.  Patient is asymptomatic from her blood pressure at this time.  Will recheck her blood pressure in 20 to 30 minutes and release her to her long-term care facility upon improvement in her blood pressure.  The patient was advised to call immediately if she has any concerning symptoms in the interval. The patient voices understanding of current disease status and treatment options and is in agreement with the current care plan. All questions were answered. The patient knows to call the clinic with any problems, questions or concerns. We can certainly see the patient much sooner if necessary  No orders of the defined types were placed in this encounter.    Donielle Radziewicz [address] Sch[address]st,  PA-C 10/22/22  ADDENDUM: Hematology/Oncology Attending: I had a face-to-face encounter with the patient today.  I reviewed her record, lab, scan and recommended her care plan.  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 The  patient underwent a course of concurrent chemoradiation with gemcitabine and completed on July 12, 2022.  She has been on observation since that time. The patient had repeat CT scan of the chest, abdomen and pelvis performed recently.  I personally and independently reviewed the scan and discussed the result with the patient today. Her scan showed no concerning findings for any disease progression or metastasis. She was supposed to meet with urology for consideration of repeat cystoscopy and evaluation of the treatment effect but the patient has significant dementia and she is not clear about her evaluation by urology. We will reach out to them for evaluating this patient. We will see her back for follow-up visit in 6 months for evaluation with repeat CT scan of the abdomen and pelvis. The patient was advised to call immediately if she has any concerning symptoms in the interval. The total time spent in the appointment was 30 minutes. Disclaimer: This note was dictated with voice recognition software. Similar sounding words can inadvertently be transcribed and may be missed upon review. Lajuana Matte, MD

## 2022-10-26 ENCOUNTER — Inpatient Hospital Stay (HOSPITAL_BASED_OUTPATIENT_CLINIC_OR_DEPARTMENT_OTHER): Payer: Medicare PPO | Admitting: Physician Assistant

## 2022-10-26 ENCOUNTER — Inpatient Hospital Stay: Payer: Medicare PPO

## 2022-10-26 ENCOUNTER — Other Ambulatory Visit: Payer: Self-pay

## 2022-10-26 VITALS — BP 202/91 | HR 71 | Temp 97.7°F | Resp 14 | Wt 135.3 lb

## 2022-10-26 DIAGNOSIS — I10 Essential (primary) hypertension: Secondary | ICD-10-CM

## 2022-10-26 DIAGNOSIS — I159 Secondary hypertension, unspecified: Secondary | ICD-10-CM

## 2022-10-26 DIAGNOSIS — Z8551 Personal history of malignant neoplasm of bladder: Secondary | ICD-10-CM | POA: Diagnosis not present

## 2022-10-26 DIAGNOSIS — C678 Malignant neoplasm of overlapping sites of bladder: Secondary | ICD-10-CM | POA: Diagnosis not present

## 2022-10-26 DIAGNOSIS — C679 Malignant neoplasm of bladder, unspecified: Secondary | ICD-10-CM

## 2022-10-26 MED ORDER — CLONIDINE HCL 0.1 MG PO TABS
0.1000 mg | ORAL_TABLET | Freq: Once | ORAL | Status: AC
  Administered 2022-10-26: 0.1 mg via ORAL
  Filled 2022-10-26: qty 1

## 2022-10-26 MED ORDER — CLONIDINE HCL 0.1 MG PO TABS: 0.1000 mg | ORAL_TABLET | Freq: Once | ORAL | Status: DC

## 2022-10-28 ENCOUNTER — Other Ambulatory Visit: Payer: Self-pay

## 2022-12-22 ENCOUNTER — Other Ambulatory Visit: Payer: Self-pay

## 2023-04-22 ENCOUNTER — Telehealth: Payer: Self-pay

## 2023-04-22 NOTE — Telephone Encounter (Signed)
Tried to reach patient through number [phone] and LVM with daughter Maxine Glenn for a return call to reschedule pt's appts for lab and provider visit due to not having a CT scan scheduled yet.

## 2023-04-25 ENCOUNTER — Encounter: Payer: Self-pay | Admitting: Internal Medicine

## 2023-04-25 NOTE — Telephone Encounter (Signed)
Spoke with the scheduler at [place] and CT scan is scheduled for pt.  Lab appt made at 1415 same day. Schedule message sent about f/u appt with provider visit about a week after the CT scan. Scheduler verbalized understanding.

## 2023-04-25 NOTE — Telephone Encounter (Signed)
Spoke with Traci Powell and [employer], Jess and informed her that pts lab and provider visit needs to be rescheduled because pt has not had a CT scan yet. Informed Jess of the centralized scheduling number, [id number]. Once CT scan is scheduled, Sharlynn Oliphant will give our office a call and get the labs/provider visit scheduled.

## 2023-04-26 ENCOUNTER — Other Ambulatory Visit: Payer: Self-pay

## 2023-04-26 ENCOUNTER — Inpatient Hospital Stay: Payer: Medicare PPO

## 2023-04-26 ENCOUNTER — Inpatient Hospital Stay: Payer: Medicare PPO | Admitting: Physician Assistant

## 2023-04-29 ENCOUNTER — Telehealth: Payer: Self-pay | Admitting: Medical Oncology

## 2023-04-29 NOTE — Telephone Encounter (Signed)
Appts Cancelled-Monica ,pts dtr,  told Shanda Bumps to cancel pts appts for labs , scan and f/u appt this month and the  January f/u. Pt is not taking her medications and is exhibiting behavior problems.   Appts cancelled . Shanda Bumps said she will call back to r/s those appts.

## 2023-05-03 ENCOUNTER — Ambulatory Visit (HOSPITAL_COMMUNITY): Payer: Medicare PPO

## 2023-05-03 ENCOUNTER — Other Ambulatory Visit: Payer: Medicare PPO

## 2023-05-06 ENCOUNTER — Other Ambulatory Visit: Payer: Self-pay

## 2023-05-06 ENCOUNTER — Telehealth: Payer: Self-pay | Admitting: Internal Medicine

## 2023-05-09 ENCOUNTER — Telehealth: Payer: Self-pay | Admitting: Internal Medicine

## 2023-05-16 ENCOUNTER — Encounter (HOSPITAL_COMMUNITY): Payer: Self-pay | Admitting: Emergency Medicine

## 2023-05-16 ENCOUNTER — Emergency Department (HOSPITAL_COMMUNITY)
Admission: EM | Admit: 2023-05-16 | Discharge: 2023-05-16 | Disposition: A | Discharge status: Home / Self Care | Payer: Medicare PPO | Attending: Emergency Medicine | Admitting: Emergency Medicine

## 2023-05-16 ENCOUNTER — Other Ambulatory Visit: Payer: Self-pay

## 2023-05-16 ENCOUNTER — Emergency Department (HOSPITAL_COMMUNITY): Payer: Medicare PPO

## 2023-05-16 DIAGNOSIS — F0280 Dementia in other diseases classified elsewhere without behavioral disturbance: Secondary | ICD-10-CM | POA: Diagnosis not present

## 2023-05-16 DIAGNOSIS — G309 Alzheimer's disease, unspecified: Secondary | ICD-10-CM | POA: Diagnosis not present

## 2023-05-16 DIAGNOSIS — D649 Anemia, unspecified: Secondary | ICD-10-CM | POA: Diagnosis present

## 2023-05-16 DIAGNOSIS — N189 Chronic kidney disease, unspecified: Secondary | ICD-10-CM | POA: Diagnosis not present

## 2023-05-16 DIAGNOSIS — Z79899 Other long term (current) drug therapy: Secondary | ICD-10-CM | POA: Diagnosis not present

## 2023-05-16 LAB — CBC WITH DIFFERENTIAL/PLATELET
Abs Immature Granulocytes: 0.02 10*3/uL (ref 0.00–0.07)
Basophils Absolute: 0 10*3/uL (ref 0.0–0.1)
Basophils Relative: 0 %
Eosinophils Absolute: 0.1 10*3/uL (ref 0.0–0.5)
Eosinophils Relative: 2 %
HCT: 25.5 % — ABNORMAL LOW (ref 36.0–46.0)
Hemoglobin: 8.3 g/dL — ABNORMAL LOW (ref 12.0–15.0)
Immature Granulocytes: 0 %
Lymphocytes Relative: 30 %
Lymphs Abs: 1.8 10*3/uL (ref 0.7–4.0)
MCH: 28.3 pg (ref 26.0–34.0)
MCHC: 32.5 g/dL (ref 30.0–36.0)
MCV: 87 fL (ref 80.0–100.0)
Monocytes Absolute: 0.9 10*3/uL (ref 0.1–1.0)
Monocytes Relative: 14 %
Neutro Abs: 3.2 10*3/uL (ref 1.7–7.7)
Neutrophils Relative %: 54 %
Platelets: 416 10*3/uL — ABNORMAL HIGH (ref 150–400)
RBC: 2.93 MIL/uL — ABNORMAL LOW (ref 3.87–5.11)
RDW: 16 % — ABNORMAL HIGH (ref 11.5–15.5)
WBC: 6.1 10*3/uL (ref 4.0–10.5)
nRBC: 0 % (ref 0.0–0.2)

## 2023-05-16 LAB — COMPREHENSIVE METABOLIC PANEL
ALT: 15 U/L (ref 0–44)
AST: 22 U/L (ref 15–41)
Albumin: 4 g/dL (ref 3.5–5.0)
Alkaline Phosphatase: 71 U/L (ref 38–126)
Anion gap: 9 (ref 5–15)
BUN: 20 mg/dL (ref 8–23)
CO2: 25 mmol/L (ref 22–32)
Calcium: 10.3 mg/dL (ref 8.9–10.3)
Chloride: 104 mmol/L (ref 98–111)
Creatinine, Ser: 1.05 mg/dL — ABNORMAL HIGH (ref 0.44–1.00)
GFR, Estimated: 54 mL/min — ABNORMAL LOW (ref >60)
Glucose, Bld: 110 mg/dL — ABNORMAL HIGH (ref 70–99)
Potassium: 3.8 mmol/L (ref 3.5–5.1)
Sodium: 138 mmol/L (ref 135–145)
Total Bilirubin: 0.4 mg/dL (ref 0.0–1.2)
Total Protein: 7.9 g/dL (ref 6.5–8.1)

## 2023-05-16 LAB — TYPE AND SCREEN
ABO/RH(D): O POS
Antibody Screen: POSITIVE
PT AG Type: NEGATIVE

## 2023-05-16 LAB — TROPONIN I (HIGH SENSITIVITY): Troponin I (High Sensitivity): 11 ng/L (ref <18)

## 2023-05-16 LAB — POC OCCULT BLOOD, ED: Fecal Occult Bld: POSITIVE — AB

## 2023-05-16 LAB — PROTIME-INR
INR: 0.9 (ref 0.8–1.2)
Prothrombin Time: 12.6 s (ref 11.4–15.2)

## 2023-05-16 MED ORDER — PANTOPRAZOLE SODIUM 20 MG PO TBEC: 20.0000 mg | DELAYED_RELEASE_TABLET | Freq: Every day | ORAL | 0 refills | Status: AC

## 2023-05-16 MED ORDER — PANTOPRAZOLE SODIUM 40 MG PO TBEC
40.0000 mg | DELAYED_RELEASE_TABLET | Freq: Every day | ORAL | Status: DC
  Administered 2023-05-16: 40 mg via ORAL
  Filled 2023-05-16: qty 1

## 2023-05-16 NOTE — ED Provider Notes (Cosign Needed)
[place] EMERGENCY DEPARTMENT AT [facility] Provider Note   CSN: [id number] Arrival date & time: 05/16/23  1722     History  Chief Complaint  Patient presents with   Low Hemoglobin    Traci Powell is a 82 y.o. female.  With a history of CKD, Alzheimer's dementia presenting to the ED for evaluation of anemia.  Patient had routine blood work drawn at her nursing home and was found to have a hemoglobin of 6.3.  Patient denies feeling abnormal.  Specifically denies weakness, shortness of breath.  She denies melena, hematochezia, hematemesis, hematuria.  She is not anticoagulated.  HPI     Home Medications Prior to Admission medications   Medication Sig Start Date End Date Taking? Authorizing Provider  pantoprazole (PROTONIX) 20 MG tablet Take 1 tablet (20 mg total) by mouth daily. 05/16/23  Yes Janara Klett, Edsel Petrin, PA-C  acetaminophen (TYLENOL) 500 MG tablet Take 1,000 mg by mouth in the morning and at bedtime.    [provider]  amLODipine (NORVASC) 5 MG tablet Take 5 mg by mouth daily. 06/15/22   [provider]  diclofenac Sodium (VOLTAREN) 1 % GEL Apply 3 g topically in the morning and at bedtime.    [provider]  divalproex (DEPAKOTE ER) 250 MG 24 hr tablet Take 250 mg by mouth 2 (two) times daily.    [provider]  docusate sodium (COLACE) 100 MG capsule Take 100 mg by mouth at bedtime.    [provider]  hydrALAZINE (APRESOLINE) 10 MG tablet Take 10 mg by mouth 2 (two) times daily. 06/02/22   [provider]  hydrOXYzine (ATARAX) 10 MG tablet Take 10 mg by mouth at bedtime. 06/02/22   [provider]  lisinopril (ZESTRIL) 20 MG tablet Take 20 mg by mouth daily.    [provider]  LORazepam (ATIVAN) 0.5 MG tablet Take 0.5 mg by mouth as needed for anxiety. When going to outside appointments    [provider]  melatonin 3 MG TABS tablet Take 10 mg by mouth at bedtime.     [provider]  memantine (NAMENDA) 10 MG tablet Take 10 mg by mouth 2 (two) times daily.    [provider]  Menthol, Topical Analgesic, (BIOFREEZE) 10 % CREA Apply 1 Application topically in the morning and at bedtime. Apply to knees    [provider]  ondansetron (ZOFRAN) 8 MG tablet Take 8 mg by mouth every 8 (eight) hours as needed for nausea or vomiting.    [provider]  pravastatin (PRAVACHOL) 10 MG tablet Take 10 mg by mouth daily.    [provider]  traMADol (ULTRAM) 50 MG tablet Take 25 mg by mouth 4 (four) times daily.    [provider]  traZODone (DESYREL) 50 MG tablet Take 50 mg by mouth at bedtime. 04/24/22   [provider]      Allergies    Iodinated contrast media    Review of Systems   Review of Systems  All other systems reviewed and are negative.   Physical Exam Updated Vital Signs BP (!) 153/64   Pulse 83   Temp 97.6 F (36.4 C)   Resp 18   SpO2 100%  Physical Exam Vitals and nursing note reviewed. Exam conducted with a chaperone present Gypsy Balsam, RN).  Constitutional:      General: She is not in acute distress.    Appearance: Normal appearance. She is normal weight. She is  not ill-appearing.     Comments: Chronically ill-appearing  HENT:     Head: Normocephalic and atraumatic.  Cardiovascular:     Rate and Rhythm: Normal rate and regular rhythm.  Pulmonary:     Effort: Pulmonary effort is normal. No respiratory distress.  Abdominal:     General: Abdomen is flat.     Tenderness: There is no abdominal tenderness. There is no guarding.  Genitourinary:    Comments: No external anal masses or lesions. No rectal masses or step offs. Observed stool is dark brown. Musculoskeletal:        General: Normal range of motion.     Cervical back: Neck supple.  Skin:    General: Skin is warm and dry.  Neurological:     Mental Status: She is alert.     Comments: Alert and oriented to self and  place  Psychiatric:        Mood and Affect: Mood normal.        Behavior: Behavior normal.     ED Results / Procedures / Treatments   Labs (all labs ordered are listed, but only abnormal results are displayed) Labs Reviewed  CBC WITH DIFFERENTIAL/PLATELET - Abnormal; Notable for the following components:      Result Value   RBC 2.93 (*)    Hemoglobin 8.3 (*)    HCT 25.5 (*)    RDW 16.0 (*)    Platelets 416 (*)    All other components within normal limits  COMPREHENSIVE METABOLIC PANEL - Abnormal; Notable for the following components:   Glucose, Bld 110 (*)    Creatinine, Ser 1.05 (*)    GFR, Estimated 54 (*)    All other components within normal limits  POC OCCULT BLOOD, ED - Abnormal; Notable for the following components:   Fecal Occult Bld POSITIVE (*)    All other components within normal limits  PROTIME-INR  TYPE AND SCREEN  TROPONIN I (HIGH SENSITIVITY)    EKG EKG Interpretation Date/Time:  Monday May 16 2023 17:30:40 EST Ventricular Rate:  81 PR Interval:  114 QRS Duration:  95 QT Interval:  401 QTC Calculation: 466 R Axis:   50  Text Interpretation: Sinus rhythm Borderline short PR interval Probable anteroseptal infarct, old Borderline ST depression, lateral leads No significant change since last tracing Confirmed by Fulton Reek [phone]) on 05/16/2023 7:44:51 PM  Radiology No results found.  Procedures Procedures    Medications Ordered in ED Medications  pantoprazole (PROTONIX) EC tablet 40 mg (has no administration in time range)    ED Course/ Medical Decision Making/ A&P                                 Medical Decision Making Amount and/or Complexity of Data Reviewed Labs: ordered. Radiology: ordered.  This patient presents to the ED for concern of anemia, this involves an extensive number of treatment options, and is a complaint that carries with it a high risk of complications and morbidity.  The differential diagnosis includes acute  blood loss anemia, anemia of chronic disease, lab error  My initial workup includes labs, rectal exam  Additional history obtained from: Nursing notes from this visit. Chart review, hemoglobin typically 8.5-10 Mother at bedside provides portion of the history  I ordered, reviewed and interpreted labs which include: CBC, CMP, INR, troponin, type and screen, Hemoccult. stable anemia with hemoglobin 8.3.  Normal INR.  No significant electrolyte derangement  or kidney dysfunction.  Hemoccult positive.  I ordered imaging studies including chest x-ray  Afebrile, hemodynamically stable.  82 year old female with a history of dementia presenting for evaluation of anemia.  Was found to have a hemoglobin of 6.3 at her nursing home on recent lab draw.  Patient denies feeling any different than normal.  No abdominal pain.  Lab workup here today with hemoglobin of 8.3 which is near her baseline of between 8.5 and 10.  Was Hemoccult positive, stool is dark brown but not melanotic.  Reports a history of colon cancer as well.  I had a shared decision-making conversation with the patient and daughter at bedside regarding further management.  She follows with GI already.  She prefers discharge home.  Believe this is reasonable.  They prefer not to stay for chest x-ray radiology read.  They are encouraged to check MyChart and return with any significant abnormalities.  They were encouraged to follow-up with her GI provider as soon as possible.  Will be started on Protonix.  They were given strict return precautions.  Stable at discharge.  At this time there does not appear to be any evidence of an acute emergency medical condition and the patient appears stable for discharge with appropriate outpatient follow up. Diagnosis was discussed with patient who verbalizes understanding of care plan and is agreeable to discharge. I have discussed return precautions with patient and daughter who is the legal guardian at bedside who  verbalizes understanding. Patient encouraged to follow-up with their GI provider as soon as possible. All questions answered.  Patient's case discussed with Dr. Earlene Plater who agrees with plan to discharge with follow-up.   Note: Portions of this report may have been transcribed using voice recognition software. Every effort was made to ensure accuracy; however, inadvertent computerized transcription errors may still be present.         Final Clinical Impression(s) / ED Diagnoses Final diagnoses:  Anemia, unspecified type    Rx / DC Orders ED Discharge Orders          Ordered    pantoprazole (PROTONIX) 20 MG tablet  Daily        05/16/23 2104              Michelle Piper, Cordelia Poche 05/16/23 2112

## 2023-05-16 NOTE — ED Triage Notes (Signed)
Pt BIB EMS from [facility] after abnormal routine labs revealed hemoglobin 6.3. Per staff, no blood in urine or stool noted, Alert and oriented to self.

## 2023-05-16 NOTE — Discharge Instructions (Addendum)
You have been seen today for your complaint of anemia. Your lab work was reassuring, however did show blood in the stool and a hemoglobin of 8.3. Your imaging has not been read by radiology.  Check your MyChart for results and return with any significant abnormalities. Your discharge medications include Protonix.  Take this as prescribed and until you can follow-up with GI.  This is a medicine used to help with potential GI bleeding. Follow up with: Your GI provider as soon as possible Please seek immediate medical care if you develop any of the following symptoms: You are short of breath. You have pain in your abdomen or chest. You are dizzy or feel faint. You have trouble concentrating. You have bloody stools, black stools, or tarry stools. You vomit repeatedly or you vomit up blood. At this time there does not appear to be the presence of an emergent medical condition, however there is always the potential for conditions to change. Please read and follow the below instructions.  Do not take your medicine if  develop an itchy rash, swelling in your mouth or lips, or difficulty breathing; call 911 and seek immediate emergency medical attention if this occurs.  You may review your lab tests and imaging results in their entirety on your MyChart account.  Please discuss all results of fully with your primary care provider and other specialist at your follow-up visit.  Note: Portions of this text may have been transcribed using voice recognition software. Every effort was made to ensure accuracy; however, inadvertent computerized transcription errors may still be present.

## 2023-05-16 NOTE — ED Notes (Signed)
[facility] called for pt 

## 2023-05-19 ENCOUNTER — Inpatient Hospital Stay: Payer: Medicare PPO | Admitting: Internal Medicine

## 2023-05-23 ENCOUNTER — Ambulatory Visit: Payer: Medicare PPO | Admitting: Internal Medicine

## 2023-06-13 ENCOUNTER — Ambulatory Visit (HOSPITAL_COMMUNITY)
Admission: RE | Admit: 2023-06-13 | Discharge: 2023-06-13 | Disposition: A | Discharge status: Home / Self Care | Payer: Medicare PPO | Source: Ambulatory Visit | Attending: Physician Assistant | Admitting: Physician Assistant

## 2023-06-13 DIAGNOSIS — C679 Malignant neoplasm of bladder, unspecified: Secondary | ICD-10-CM | POA: Insufficient documentation

## 2023-06-20 ENCOUNTER — Telehealth: Payer: Self-pay | Admitting: Physician Assistant

## 2023-06-20 NOTE — Telephone Encounter (Signed)
Scheduled appointments per 2/3 scheduling message. Spoke to the patients nurse, Shanda Bumps.

## 2023-06-21 ENCOUNTER — Other Ambulatory Visit: Payer: Self-pay

## 2023-06-21 ENCOUNTER — Other Ambulatory Visit: Payer: Self-pay | Admitting: Physician Assistant

## 2023-06-21 DIAGNOSIS — D649 Anemia, unspecified: Secondary | ICD-10-CM

## 2023-06-21 NOTE — Progress Notes (Signed)
 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 The patient is not a good surgical candidate for bladder resection.   PRIOR THERAPY: Current chemoradiation with gemcitabine  at a lower dose of 27 Mg/M2 on days 1, 4, 8, 11, 15, 18, 22 and 25 for the first cycle and similar treatment for the second cycle until day 18.  Last dose on 07/12/22     CURRENT THERAPY: Observation   INTERVAL HISTORY: Traci Powell 83 y.o. female returns to the clinic today for a follow up visit accompanied by Traci Powell, staff member from her SNF. The patient was last seen in the clinic on 10/26/22. The patient has dementia and lives in a skilled nursing facility. In summary, the patient was diagnosed in December 2023 with bladder cancer. She underwent chemotherapy and radiation which was completed in February 2024. She tolerated it well despite her advanced dementia. She follows with Dr. Watt from urology. She has an upcoming appointment with them on 07/05/23.   Per chart review, she presented to the ER on 05/16/23 for anemia. Her Hbg was noted to be low at 6.3. Her hemoccult was positive but stool was brown but not melanotic. She was started on Protonix  and they recommended follow up with GI. She saw Dr. Kriss regarding this. The patient has dementia and her staff member was not at the appointment. We are planning to call the patient's daughter, Traci Powell, today with a summary of her visit. We will ask her at that time what was discussed at the appointment with GI.   The patient is not taking any iron  supplements for her anemia.  Per the patient's caregiver from the skilled nursing facility, the patient has had a decline in her mentation, generalized weakness, and appetite since she was last seen 6 months ago.   Otherwise she does not have any significant changes in her health.  Besides the intermittent periodic rectal bleeding, she has not had any other visible bleeding. She denies any fever, chills, or night sweats. Denies any nausea, vomiting, diarrhea, or constipation. Denies any chest pain, shortness of breath, cough, or hemoptysis. Denies any abdominal pain, dysuria, or hematuria. Denies any unusual back pain. She recently had a restaging CT scan performed. She is here today for evaluation and to review her scan results.   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  MEDICATIONS:  Current Outpatient Medications  Medication Sig Dispense Refill   acetaminophen  (TYLENOL ) 500 MG tablet Take 1,000 mg by mouth in the morning and at bedtime.     amLODipine (NORVASC) 5 MG tablet Take 5 mg by mouth daily.     diclofenac Sodium (VOLTAREN) 1 % GEL Apply 3 g topically in the morning and at bedtime.     divalproex  (DEPAKOTE  ER) 250 MG 24 hr tablet Take 250 mg by mouth 2 (two) times daily.     docusate sodium (COLACE) 100 MG capsule Take 100 mg by mouth at bedtime.     hydrALAZINE  (APRESOLINE ) 10 MG tablet Take 10 mg by mouth  2 (two) times daily.     hydrOXYzine (ATARAX) 10 MG tablet Take 10 mg by mouth at bedtime.     lisinopril  (ZESTRIL ) 20 MG tablet Take 20 mg by mouth daily.     LORazepam  (ATIVAN ) 0.5 MG tablet Take 0.5 mg by mouth as needed for anxiety. When going to outside appointments     melatonin 3 MG TABS tablet Take 10 mg by mouth at bedtime.     memantine (NAMENDA) 10 MG tablet Take 10 mg by mouth 2 (two) times daily.     Menthol, Topical Analgesic, (BIOFREEZE) 10 % CREA Apply 1 Application topically in the morning and at bedtime. Apply to knees     ondansetron   (ZOFRAN ) 8 MG tablet Take 8 mg by mouth every 8 (eight) hours as needed for nausea or vomiting.     pantoprazole  (PROTONIX ) 20 MG tablet Take 1 tablet (20 mg total) by mouth daily. 30 tablet 0   pravastatin (PRAVACHOL) 10 MG tablet Take 10 mg by mouth daily.     traMADol (ULTRAM) 50 MG tablet Take 25 mg by mouth 4 (four) times daily.     traZODone (DESYREL) 50 MG tablet Take 50 mg by mouth at bedtime.     No current facility-administered medications for this visit.   Facility-Administered Medications Ordered in Other Visits  Medication Dose Route Frequency Provider Last Rate Last Admin   0.9 %  sodium chloride  infusion   Intravenous Once Sherrod Sherrod, MD        SURGICAL HISTORY:  Past Surgical History:  Procedure Laterality Date   ABDOMINAL HYSTERECTOMY     COLON SURGERY  2007   resection W/ ileostomy and reversal   TRANSURETHRAL RESECTION OF BLADDER TUMOR WITH MITOMYCIN -C N/A 04/22/2022   Procedure: TRANSURETHRAL RESECTION OF BLADDER TUMOR;  Surgeon: Watt Rush, MD;  Location: [facility];  Service: Urology;  Laterality: N/A;    REVIEW OF SYSTEMS:   Constitutional: Positive for increased fatigue and decreased appetite over the last 6 months. Negative for chills and fever. HENT:  Negative for mouth sores, nosebleeds, sore throat and trouble swallowing.   Eyes: Negative for eye problems and icterus.  Respiratory: Negative for cough, hemoptysis, shortness of breath and wheezing.   Cardiovascular: Negative for chest pain and leg swelling.  Gastrointestinal: Positive for intermittent rectal bleeding. Negative for abdominal pain, constipation, diarrhea, nausea and vomiting.  Genitourinary: Negative for bladder incontinence, difficulty urinating, dysuria, frequency and hematuria.   Musculoskeletal: Negative for back pain, gait problem, neck pain and neck stiffness.  Skin: Negative for itching and rash.  Neurological: Negative for dizziness, headaches, light-headedness and seizures.   Hematological: Negative for adenopathy. Does not bruise/bleed easily.  Psychiatric/Behavioral: Positive for dementia. Negative for confusion, depression and sleep disturbance. The patient is not nervous/anxious.        PHYSICAL EXAMINATION:  There were no vitals taken for this visit.  ECOG PERFORMANCE STATUS: 3  Physical Exam  Constitutional: Oriented to person, place, and time and thin appearing female, and in no distress.  HENT:  Head: Normocephalic and atraumatic.  Mouth/Throat: Oropharynx is clear and moist. No oropharyngeal exudate.  Eyes: Conjunctivae are normal. Right eye exhibits no discharge. Left eye exhibits no discharge. No scleral icterus.  Neck: Normal range of motion. Neck supple.  Cardiovascular: Normal rate, regular rhythm, normal heart sounds and intact distal pulses.   Pulmonary/Chest: Effort normal and breath sounds normal. No respiratory distress. No wheezes. No rales.  Abdominal: Soft. Bowel sounds are normal. Exhibits no distension  and no mass. There is no tenderness.  Musculoskeletal: Normal range of motion. Exhibits no edema.  Lymphadenopathy:    No cervical adenopathy.  Neurological: Alert and oriented to person, place, and time. Exhibits muscle wasting. She was examined in the wheelchair.  Skin: Skin is warm and dry. No rash noted. Not diaphoretic. No erythema. No pallor.  Psychiatric: Mood, memory and judgment normal.  Vitals reviewed.  LABORATORY DATA: Lab Results  Component Value Date   WBC 6.1 05/16/2023   HGB 8.3 (L) 05/16/2023   HCT 25.5 (L) 05/16/2023   MCV 87.0 05/16/2023   PLT 416 (H) 05/16/2023      Chemistry      Component Value Date/Time   NA 138 05/16/2023 1848   K 3.8 05/16/2023 1848   CL 104 05/16/2023 1848   CO2 25 05/16/2023 1848   BUN 20 05/16/2023 1848   CREATININE 1.05 (H) 05/16/2023 1848   CREATININE 0.98 10/22/2022 1015      Component Value Date/Time   CALCIUM 10.3 05/16/2023 1848   ALKPHOS 71 05/16/2023 1848   AST  22 05/16/2023 1848   AST 11 (L) 10/22/2022 1015   ALT 15 05/16/2023 1848   ALT 6 10/22/2022 1015   BILITOT 0.4 05/16/2023 1848   BILITOT 0.2 (L) 10/22/2022 1015       RADIOGRAPHIC STUDIES:  CT Abdomen Pelvis Wo Contrast Result Date: 06/20/2023 CLINICAL DATA:  Bladder cancer diagnosed in 2023 with transurethral resection. Prior chemotherapy/radiation therapy. EXAM: CT ABDOMEN AND PELVIS WITHOUT CONTRAST TECHNIQUE: Multidetector CT imaging of the abdomen and pelvis was performed following the standard protocol without IV contrast. RADIATION DOSE REDUCTION: This exam was performed according to the departmental dose-optimization program which includes automated exposure control, adjustment of the mA and/or kV according to patient size and/or use of iterative reconstruction technique. COMPARISON:  10/22/2022 FINDINGS: Lower chest: Bibasilar scarring. Minimal motion degradation. Patient's arms are not raised above the head, further degraded exam. Normal heart size without pericardial or pleural effusion. Hepatobiliary: Normal liver. Favor artifact over dependent gallstones or sludge. No acute cholecystitis or biliary duct dilatation. Pancreas: Normal, without mass or ductal dilatation. Spleen: Normal in size, without focal abnormality. Adrenals/Urinary Tract: Left adrenal thickening without well-defined nodule or mass. Normal right adrenal gland. Punctate lower pole left renal collecting system calculus. No right renal calculi or hydronephrosis. No hydroureter or ureteric calculi. No bladder calculi. No dominant bladder mass. Stomach/Bowel: Normal stomach, without wall thickening. Surgical sutures in the low rectum/anus. Perirectal interstitial thickening is unchanged and may be treatment related. Large stool burden throughout the distal colon. There are also surgical sutures in the sigmoid. Prior right lower abdominal enterotomy. A tiny right paramidline abdominal wall hernia contains nonobstructive small  bowel on 48/2. A defect is likely present on the prior but the bowel involvement is new. Vascular/Lymphatic: Aortic atherosclerosis. A left periaortic node is upper normal sized at 9 mm on 38/2, similar and therefore likely reactive. No pelvic sidewall adenopathy. Reproductive: Hysterectomy.  No adnexal mass. Other: No significant free fluid.  No free intraperitoneal air. Musculoskeletal: Osteopenia. Left-greater-than-right advanced hip osteoarthritis. Lumbosacral spondylosis IMPRESSION: 1. Given limitations of noncontrast exam, no residual/recurrent or metastatic bladder cancer. 2. Colonic stool burden suggests constipation or even fecal impaction. Extensive surgical changes, including within the rectum, sigmoid, and distal small bowel. 3. Right paramidline ventral abdominal wall hernia contains minimal nonobstructive small bowel, new. 4.  Aortic Atherosclerosis (ICD10-I70.0). 5. Left nephrolithiasis. Electronically Signed   By: Rockey Kilts M.D.   On:  06/20/2023 13:03     ASSESSMENT/PLAN:  This is a very pleasant 83 year old African-American female with unresectable stage II (T2, N0, M0) urothelial carcinoma of the bladder.  This was diagnosed in December 2023.  She is status post TURP with resection of the tumor.  The patient was not a good candidate for bladder resection.   She completed a course of concurrent chemoradiation with gemcitabine  at a low dose of 27 mg/m on days 1, 4, 8, 11, 15, 18, 22, and 25.  Her first dose of treatment was on 05/24/2022. She completed this on 07/12/22.  Her last day radiation was on 07/20/2022.    The patient recently had a restaging CT scan performed.  Patient was seen with Dr. Sherrod today.  Dr. Sherrod personally and independently reviewed the scan discussed results with the patient today.  The scan showed no evidence of thoracic metastases or any evidence of bladder cancer recurrence or metastatic disease.    The patient does not need any systemic treatment at this  time.  Encouraged her to continue to follow-up with Dr. Nguyen.  She has an upcoming appointment on 07/05/2023.  Her hemoglobin is 7.7.  She is following closely with GI.  We will call her daughter to give a summary from her visit today.  In the meantime she will return today at 1 PM for 1 unit of blood.  I also drew iron  studies which appear to be low.  We will arrange for IV iron  at the [facility] history infusion center.  I also sent her prescription of ferrous sulfate  to take 1 tablet p.o. daily.  We will see her back for follow-up visit in 6 months with a repeat CT scan of the abdomen and pelvis.   We will order this without IV contrast due to her iodine allergy.   The patient was advised to call immediately if she has any concerning symptoms in the interval. The patient voices understanding of current disease status and treatment options and is in agreement with the current care plan. All questions were answered. The patient knows to call the clinic with any problems, questions or concerns. We can certainly see the patient much sooner if necessary    No orders of the defined types were placed in this encounter.   Vannary Greening L Libia Fazzini, PA-C 06/21/23  ADDENDUM: Hematology/Oncology Attending: I had a face-to-face encounter with the patient today.  I reviewed her records, lab, scan and recommended her care plan.  This is a very pleasant 83 years old African-American female with significant dementia and history of stage II urothelial carcinoma of the bladder diagnosed in December 2023 status post TURP as well as a course of concurrent chemoradiation with low-dose gemcitabine  completed in February 2024.  The patient has been on observation since that time.  She is feeling fine except for fatigue. She had repeat CT scan of the abdomen pelvis that showed no concerning findings for disease progression but her blood work showed significant iron  deficiency anemia and she is followed by  gastroenterology. Will arrange for the patient to receive 1 unit of PRBCs transfusion and we will start her on iron  supplements. We will see the patient back for follow-up visit in 6 months for evaluation and repeat CT scan of the abdomen pelvis for restaging of her disease. The patient was advised to call immediately if she has any concerning symptoms in the interval. The total time spent in the appointment was 30 minutes. Disclaimer: This note was dictated with voice  recognition software. Similar sounding words can inadvertently be transcribed and may be missed upon review. Sherrod MARLA Sherrod, MD

## 2023-06-22 ENCOUNTER — Other Ambulatory Visit: Payer: Self-pay

## 2023-06-22 ENCOUNTER — Telehealth: Payer: Self-pay

## 2023-06-22 ENCOUNTER — Inpatient Hospital Stay: Payer: Medicare PPO | Attending: Physician Assistant

## 2023-06-22 ENCOUNTER — Inpatient Hospital Stay (HOSPITAL_BASED_OUTPATIENT_CLINIC_OR_DEPARTMENT_OTHER): Payer: Medicare PPO | Admitting: Physician Assistant

## 2023-06-22 ENCOUNTER — Inpatient Hospital Stay: Payer: Medicare PPO

## 2023-06-22 VITALS — BP 121/60 | HR 81 | Temp 98.2°F | Resp 16 | Wt 133.7 lb

## 2023-06-22 DIAGNOSIS — R630 Anorexia: Secondary | ICD-10-CM | POA: Diagnosis not present

## 2023-06-22 DIAGNOSIS — C679 Malignant neoplasm of bladder, unspecified: Secondary | ICD-10-CM

## 2023-06-22 DIAGNOSIS — D649 Anemia, unspecified: Secondary | ICD-10-CM

## 2023-06-22 DIAGNOSIS — Z8551 Personal history of malignant neoplasm of bladder: Secondary | ICD-10-CM | POA: Diagnosis not present

## 2023-06-22 DIAGNOSIS — R531 Weakness: Secondary | ICD-10-CM | POA: Diagnosis not present

## 2023-06-22 DIAGNOSIS — D509 Iron deficiency anemia, unspecified: Secondary | ICD-10-CM | POA: Diagnosis not present

## 2023-06-22 DIAGNOSIS — F0390 Unspecified dementia without behavioral disturbance: Secondary | ICD-10-CM | POA: Insufficient documentation

## 2023-06-22 LAB — CBC WITH DIFFERENTIAL (CANCER CENTER ONLY)
Abs Immature Granulocytes: 0.02 10*3/uL (ref 0.00–0.07)
Basophils Absolute: 0 10*3/uL (ref 0.0–0.1)
Basophils Relative: 0 %
Eosinophils Absolute: 0.2 10*3/uL (ref 0.0–0.5)
Eosinophils Relative: 3 %
HCT: 23.6 % — ABNORMAL LOW (ref 36.0–46.0)
Hemoglobin: 7.7 g/dL — ABNORMAL LOW (ref 12.0–15.0)
Immature Granulocytes: 0 %
Lymphocytes Relative: 20 %
Lymphs Abs: 1.2 10*3/uL (ref 0.7–4.0)
MCH: 26.3 pg (ref 26.0–34.0)
MCHC: 32.6 g/dL (ref 30.0–36.0)
MCV: 80.5 fL (ref 80.0–100.0)
Monocytes Absolute: 0.9 10*3/uL (ref 0.1–1.0)
Monocytes Relative: 16 %
Neutro Abs: 3.5 10*3/uL (ref 1.7–7.7)
Neutrophils Relative %: 61 %
Platelet Count: 309 10*3/uL (ref 150–400)
RBC: 2.93 MIL/uL — ABNORMAL LOW (ref 3.87–5.11)
RDW: 16.3 % — ABNORMAL HIGH (ref 11.5–15.5)
WBC Count: 5.8 10*3/uL (ref 4.0–10.5)
nRBC: 0 % (ref 0.0–0.2)

## 2023-06-22 LAB — PREPARE RBC (CROSSMATCH)

## 2023-06-22 LAB — CMP (CANCER CENTER ONLY)
ALT: 9 U/L (ref 0–44)
AST: 14 U/L — ABNORMAL LOW (ref 15–41)
Albumin: 4.1 g/dL (ref 3.5–5.0)
Alkaline Phosphatase: 70 U/L (ref 38–126)
Anion gap: 6 (ref 5–15)
BUN: 32 mg/dL — ABNORMAL HIGH (ref 8–23)
CO2: 31 mmol/L (ref 22–32)
Calcium: 11.1 mg/dL — ABNORMAL HIGH (ref 8.9–10.3)
Chloride: 103 mmol/L (ref 98–111)
Creatinine: 1.33 mg/dL — ABNORMAL HIGH (ref 0.44–1.00)
GFR, Estimated: 40 mL/min — ABNORMAL LOW (ref >60)
Glucose, Bld: 99 mg/dL (ref 70–99)
Potassium: 4.5 mmol/L (ref 3.5–5.1)
Sodium: 140 mmol/L (ref 135–145)
Total Bilirubin: 0.2 mg/dL (ref 0.0–1.2)
Total Protein: 7.5 g/dL (ref 6.5–8.1)

## 2023-06-22 LAB — IRON AND IRON BINDING CAPACITY (CC-WL,HP ONLY)
Iron: 24 ug/dL — ABNORMAL LOW (ref 28–170)
Saturation Ratios: 5 % — ABNORMAL LOW (ref 10.4–31.8)
TIBC: 490 ug/dL — ABNORMAL HIGH (ref 250–450)
UIBC: 466 ug/dL — ABNORMAL HIGH (ref 148–442)

## 2023-06-22 LAB — SAMPLE TO BLOOD BANK

## 2023-06-22 LAB — FERRITIN: Ferritin: 8 ng/mL — ABNORMAL LOW (ref 11–307)

## 2023-06-22 MED ORDER — SODIUM CHLORIDE 0.9% IV SOLUTION
250.0000 mL | INTRAVENOUS | Status: DC
  Administered 2023-06-22: 100 mL via INTRAVENOUS

## 2023-06-22 MED ORDER — DIPHENHYDRAMINE HCL 25 MG PO CAPS
25.0000 mg | ORAL_CAPSULE | Freq: Once | ORAL | Status: AC
  Administered 2023-06-22: 25 mg via ORAL
  Filled 2023-06-22: qty 1

## 2023-06-22 MED ORDER — ACETAMINOPHEN 325 MG PO TABS
650.0000 mg | ORAL_TABLET | Freq: Once | ORAL | Status: AC
  Administered 2023-06-22: 650 mg via ORAL
  Filled 2023-06-22: qty 2

## 2023-06-22 MED ORDER — FERROUS SULFATE 325 (65 FE) MG PO TBEC: 325.0000 mg | DELAYED_RELEASE_TABLET | Freq: Every day | ORAL | 3 refills | Status: AC

## 2023-06-22 NOTE — Telephone Encounter (Signed)
 Traci Powell, patient will be scheduled as soon as possible.  Auth Submission: NO AUTH NEEDED Site of care: Site of care: [facility] Payer: [employer] medicare and [place] Medicaid Medication & CPT/J Code(s) submitted: Venofer  (Iron  Sucrose) J1756 Route of submission (phone, fax, portal):  Phone # Fax # Auth type: Buy/Bill PB Units/visits requested: 300mg  x 3 doses Reference number:  Approval from: 06/22/23 to 11/19/23

## 2023-06-22 NOTE — Patient Instructions (Addendum)
 Instructions: -It looks like you were in the emergency room last month for new onset anemia.  This is when red blood cells are low.  It looks like they checked your stool which was positive for blood.  You definitely need to see a gastroenterologist.  Please continue to follow with Dr. Kriss.  -You are also checking your iron  studies today to see why you are anemic.  We may recommend IV iron  infusions.  We may also recommend oral iron  supplements to take 1 tablet daily. -If you have not seen your urologist recently, please reach out to [facility] to make a follow-up appointment for routine cystoscopies.  You do not have any appointments please call [phone] -We will try to give you a blood transfusion due to your anemia -Your scan had some incidental findings. Her scan showed some constipation. Consider administering stool softeners and laxatives if she is not having regular bowel movement.

## 2023-06-22 NOTE — Patient Instructions (Signed)

## 2023-06-22 NOTE — Telephone Encounter (Signed)
 Per C. Heilingoetter, PA, pt's daughter [name] called to give an update about pt's condition/treatment. Informed her about the need for a blood transfusion today and that she will soon be getting a call to set up an IV iron  infusion w/ [facility]. Also informed that C. Heilingoetter has ordered an oral iron  supplement for her to take. Advised that pt needs to continue follow ups w/ her GI MD and urologist as directed. Daughter verbalizes understanding.

## 2023-06-23 LAB — TYPE AND SCREEN
ABO/RH(D): O POS
Antibody Screen: POSITIVE
Donor AG Type: NEGATIVE
Unit division: 0

## 2023-06-23 LAB — BPAM RBC
Blood Product Expiration Date: 202502142359
ISSUE DATE / TIME: 202502051426
Unit Type and Rh: 9500

## 2023-06-24 ENCOUNTER — Other Ambulatory Visit: Payer: Self-pay

## 2023-07-01 ENCOUNTER — Ambulatory Visit: Payer: Medicare PPO

## 2023-07-01 VITALS — BP 134/78 | HR 72 | Temp 98.3°F | Resp 16 | Ht 65.0 in | Wt 131.0 lb

## 2023-07-01 DIAGNOSIS — D509 Iron deficiency anemia, unspecified: Secondary | ICD-10-CM

## 2023-07-01 MED ORDER — ACETAMINOPHEN 325 MG PO TABS
650.0000 mg | ORAL_TABLET | Freq: Once | ORAL | Status: AC
  Administered 2023-07-01: 650 mg via ORAL

## 2023-07-01 MED ORDER — SODIUM CHLORIDE 0.9 % IV SOLN
300.0000 mg | Freq: Once | INTRAVENOUS | Status: AC
  Administered 2023-07-01: 300 mg via INTRAVENOUS
  Filled 2023-07-01: qty 15

## 2023-07-01 MED ORDER — DIPHENHYDRAMINE HCL 25 MG PO CAPS
25.0000 mg | ORAL_CAPSULE | Freq: Once | ORAL | Status: AC
  Administered 2023-07-01: 25 mg via ORAL

## 2023-07-01 NOTE — Progress Notes (Signed)
Diagnosis: Iron Deficiency Anemia  Provider:  Chilton Greathouse MD  Procedure: IV Infusion  IV Type: Peripheral, IV Location: R Forearm  Venofer (Iron Sucrose), Dose: 300 mg  Infusion Start Time: 0905  Infusion Stop Time: 1047  Post Infusion IV Care: Observation period completed and Peripheral IV Discontinued  Discharge: Condition: Stable, Destination: Skilled nursing facility . AVS Provided  Performed by:  Wyvonne Lenz, RN

## 2023-07-08 ENCOUNTER — Ambulatory Visit: Payer: Medicare PPO | Admitting: *Deleted

## 2023-07-08 VITALS — BP 150/83 | HR 69 | Temp 98.3°F | Resp 16 | Ht 65.0 in | Wt 131.0 lb

## 2023-07-08 DIAGNOSIS — D509 Iron deficiency anemia, unspecified: Secondary | ICD-10-CM

## 2023-07-08 MED ORDER — ACETAMINOPHEN 325 MG PO TABS
650.0000 mg | ORAL_TABLET | Freq: Once | ORAL | Status: AC
  Administered 2023-07-08: 650 mg via ORAL
  Filled 2023-07-08: qty 2

## 2023-07-08 MED ORDER — DIPHENHYDRAMINE HCL 25 MG PO CAPS
25.0000 mg | ORAL_CAPSULE | Freq: Once | ORAL | Status: AC
  Administered 2023-07-08: 25 mg via ORAL
  Filled 2023-07-08: qty 1

## 2023-07-08 MED ORDER — IRON SUCROSE 300 MG IVPB - SIMPLE MED
300.0000 mg | Freq: Once | Status: AC
  Administered 2023-07-08: 300 mg via INTRAVENOUS

## 2023-07-08 NOTE — Progress Notes (Signed)
Diagnosis: Iron Deficiency Anemia  Provider:  Chilton Greathouse MD  Procedure: IV Infusion  IV Type: Peripheral, IV Location: L Antecubital  Venofer (Iron Sucrose), Dose: 300 mg  Infusion Start Time: 0905 am  Infusion Stop Time: 1052 am  Post Infusion IV Care: Observation period completed and Peripheral IV Discontinued  Discharge: Condition: Good, Destination: Home . AVS Provided  Performed by:  Forrest Moron, RN

## 2023-07-15 ENCOUNTER — Ambulatory Visit (INDEPENDENT_AMBULATORY_CARE_PROVIDER_SITE_OTHER): Payer: Medicare PPO

## 2023-07-15 VITALS — BP 160/81 | HR 70 | Temp 97.7°F | Resp 16 | Wt 131.0 lb

## 2023-07-15 DIAGNOSIS — D509 Iron deficiency anemia, unspecified: Secondary | ICD-10-CM | POA: Diagnosis not present

## 2023-07-15 MED ORDER — DIPHENHYDRAMINE HCL 25 MG PO CAPS
25.0000 mg | ORAL_CAPSULE | Freq: Once | ORAL | Status: AC
  Administered 2023-07-15: 25 mg via ORAL
  Filled 2023-07-15: qty 1

## 2023-07-15 MED ORDER — IRON SUCROSE 300 MG IVPB - SIMPLE MED: 300.0000 mg | Freq: Once | Status: DC

## 2023-07-15 MED ORDER — SODIUM CHLORIDE 0.9 % IV SOLN
300.0000 mg | Freq: Once | INTRAVENOUS | Status: AC
  Administered 2023-07-15: 300 mg via INTRAVENOUS
  Filled 2023-07-15: qty 15

## 2023-07-15 MED ORDER — ACETAMINOPHEN 325 MG PO TABS
650.0000 mg | ORAL_TABLET | Freq: Once | ORAL | Status: AC
  Administered 2023-07-15: 650 mg via ORAL
  Filled 2023-07-15: qty 2

## 2023-07-15 NOTE — Progress Notes (Signed)
 Diagnosis: Iron Deficiency Anemia  Provider:  Chilton Greathouse MD  Procedure: IV Infusion  IV Type: Peripheral, IV Location: L Antecubital  Venofer (Iron Sucrose), Dose: 300 mg  Infusion Start Time: 0906  Infusion Stop Time: 1049  Post Infusion IV Care: Observation period completed and Peripheral IV Discontinued  Discharge: Condition: Good, Destination: Home . AVS Provided  Performed by:  Loney Hering, LPN

## 2023-08-26 ENCOUNTER — Encounter: Payer: Self-pay | Admitting: Gerontology

## 2023-08-29 ENCOUNTER — Other Ambulatory Visit: Payer: Self-pay | Admitting: Medical Oncology

## 2023-08-29 ENCOUNTER — Telehealth: Payer: Self-pay | Admitting: Medical Oncology

## 2023-08-29 DIAGNOSIS — D649 Anemia, unspecified: Secondary | ICD-10-CM

## 2023-08-29 NOTE — Telephone Encounter (Signed)
 Received labs from [facility] re hgb 6.6 on 04/10. Ordered by Bernie Brinks.  No answer at [facility] . LVM on Monica's phone. I do not know if she was transfused.

## 2023-08-29 NOTE — Telephone Encounter (Signed)
 My call was transferred [facility]. LVM on both dtrs phone to return my call.

## 2023-08-29 NOTE — Telephone Encounter (Signed)
 Traci Powell at [facility] confirmed lab appt and blood transfusion for Friday 09/02/23.

## 2023-08-29 NOTE — Progress Notes (Signed)
 blood transfusion ordered.

## 2023-08-29 NOTE — Telephone Encounter (Signed)
 Traci Powell stated to call [facility] to set up blood transfusion.

## 2023-08-29 NOTE — Telephone Encounter (Signed)
 LVM to return my call re scheduling transfusion of 2 units of blood.

## 2023-08-31 ENCOUNTER — Other Ambulatory Visit: Payer: Self-pay | Admitting: Physician Assistant

## 2023-08-31 DIAGNOSIS — D649 Anemia, unspecified: Secondary | ICD-10-CM

## 2023-09-01 ENCOUNTER — Encounter: Payer: Self-pay | Admitting: Internal Medicine

## 2023-09-01 ENCOUNTER — Telehealth: Payer: Self-pay | Admitting: Medical Oncology

## 2023-09-01 NOTE — Telephone Encounter (Signed)
 Monica aware of [facility] and transfusion appts tomorrow and she will stay with pt during her blood transfusion.

## 2023-09-01 NOTE — Telephone Encounter (Signed)
 LVM to confirm pts appts tomorrow for lab and blood transfusion.

## 2023-09-02 ENCOUNTER — Other Ambulatory Visit: Payer: Self-pay

## 2023-09-02 ENCOUNTER — Inpatient Hospital Stay

## 2023-09-02 ENCOUNTER — Inpatient Hospital Stay: Admitting: Physician Assistant

## 2023-09-02 ENCOUNTER — Inpatient Hospital Stay: Attending: Internal Medicine

## 2023-09-02 DIAGNOSIS — D649 Anemia, unspecified: Secondary | ICD-10-CM | POA: Insufficient documentation

## 2023-09-02 LAB — CBC WITH DIFFERENTIAL (CANCER CENTER ONLY)
Abs Immature Granulocytes: 0.01 10*3/uL (ref 0.00–0.07)
Basophils Absolute: 0 10*3/uL (ref 0.0–0.1)
Basophils Relative: 1 %
Eosinophils Absolute: 0.2 10*3/uL (ref 0.0–0.5)
Eosinophils Relative: 5 %
HCT: 23.6 % — ABNORMAL LOW (ref 36.0–46.0)
Hemoglobin: 7.9 g/dL — ABNORMAL LOW (ref 12.0–15.0)
Immature Granulocytes: 0 %
Lymphocytes Relative: 19 %
Lymphs Abs: 1 10*3/uL (ref 0.7–4.0)
MCH: 29.8 pg (ref 26.0–34.0)
MCHC: 33.5 g/dL (ref 30.0–36.0)
MCV: 89.1 fL (ref 80.0–100.0)
Monocytes Absolute: 0.9 10*3/uL (ref 0.1–1.0)
Monocytes Relative: 17 %
Neutro Abs: 3 10*3/uL (ref 1.7–7.7)
Neutrophils Relative %: 58 %
Platelet Count: 314 10*3/uL (ref 150–400)
RBC: 2.65 MIL/uL — ABNORMAL LOW (ref 3.87–5.11)
RDW: 19.4 % — ABNORMAL HIGH (ref 11.5–15.5)
WBC Count: 5.1 10*3/uL (ref 4.0–10.5)
nRBC: 0 % (ref 0.0–0.2)

## 2023-09-02 LAB — SAMPLE TO BLOOD BANK

## 2023-09-02 LAB — PREPARE RBC (CROSSMATCH)

## 2023-09-02 MED ORDER — DIPHENHYDRAMINE HCL 25 MG PO CAPS
25.0000 mg | ORAL_CAPSULE | Freq: Once | ORAL | Status: AC
  Administered 2023-09-02: 25 mg via ORAL
  Filled 2023-09-02: qty 1

## 2023-09-02 MED ORDER — ACETAMINOPHEN 325 MG PO TABS
650.0000 mg | ORAL_TABLET | Freq: Once | ORAL | Status: AC
  Administered 2023-09-02: 650 mg via ORAL
  Filled 2023-09-02: qty 2

## 2023-09-02 MED ORDER — DIPHENHYDRAMINE HCL 25 MG PO CAPS
25.0000 mg | ORAL_CAPSULE | Freq: Four times a day (QID) | ORAL | Status: DC | PRN
  Administered 2023-09-02: 25 mg via ORAL
  Filled 2023-09-02: qty 1

## 2023-09-02 MED ORDER — SODIUM CHLORIDE 0.9% IV SOLUTION
250.0000 mL | INTRAVENOUS | Status: DC
  Administered 2023-09-02: 100 mL via INTRAVENOUS

## 2023-09-02 NOTE — Progress Notes (Signed)
 Patient hgb 7.9 today, per Dr Marguerita Shih proceed with 2 units pRBC as ordered d/t previous drop in hgb (pt hgb 6.6 08/25/23 per notes).

## 2023-09-02 NOTE — Patient Instructions (Signed)

## 2023-09-05 LAB — TYPE AND SCREEN
ABO/RH(D): O POS
Antibody Screen: POSITIVE
Donor AG Type: NEGATIVE
Donor AG Type: NEGATIVE
Unit division: 0
Unit division: 0

## 2023-09-05 LAB — BPAM RBC
Blood Product Expiration Date: 202505202359
Blood Product Expiration Date: 202505202359
ISSUE DATE / TIME: 202504181104
ISSUE DATE / TIME: 202504181104
Unit Type and Rh: 5100
Unit Type and Rh: 5100

## 2023-09-06 ENCOUNTER — Other Ambulatory Visit: Payer: Self-pay

## 2023-09-06 ENCOUNTER — Encounter (HOSPITAL_COMMUNITY): Payer: Self-pay

## 2023-09-06 ENCOUNTER — Emergency Department (HOSPITAL_COMMUNITY)

## 2023-09-06 ENCOUNTER — Emergency Department (HOSPITAL_COMMUNITY)
Admission: EM | Admit: 2023-09-06 | Discharge: 2023-09-06 | Disposition: A | Discharge status: Home / Self Care | Attending: Emergency Medicine | Admitting: Emergency Medicine

## 2023-09-06 DIAGNOSIS — M25551 Pain in right hip: Secondary | ICD-10-CM

## 2023-09-06 DIAGNOSIS — G309 Alzheimer's disease, unspecified: Secondary | ICD-10-CM | POA: Insufficient documentation

## 2023-09-06 DIAGNOSIS — M25562 Pain in left knee: Secondary | ICD-10-CM | POA: Diagnosis not present

## 2023-09-06 DIAGNOSIS — M1611 Unilateral primary osteoarthritis, right hip: Secondary | ICD-10-CM | POA: Diagnosis not present

## 2023-09-06 LAB — BASIC METABOLIC PANEL WITH GFR
Anion gap: 9 (ref 5–15)
BUN: 27 mg/dL — ABNORMAL HIGH (ref 8–23)
CO2: 23 mmol/L (ref 22–32)
Calcium: 9.9 mg/dL (ref 8.9–10.3)
Chloride: 110 mmol/L (ref 98–111)
Creatinine, Ser: 0.77 mg/dL (ref 0.44–1.00)
GFR, Estimated: >60 mL/min (ref >60)
Glucose, Bld: 77 mg/dL (ref 70–99)
Potassium: 4.1 mmol/L (ref 3.5–5.1)
Sodium: 142 mmol/L (ref 135–145)

## 2023-09-06 LAB — CBC WITH DIFFERENTIAL/PLATELET
Abs Immature Granulocytes: 0.02 10*3/uL (ref 0.00–0.07)
Basophils Absolute: 0 10*3/uL (ref 0.0–0.1)
Basophils Relative: 0 %
Eosinophils Absolute: 0.3 10*3/uL (ref 0.0–0.5)
Eosinophils Relative: 5 %
HCT: 34.6 % — ABNORMAL LOW (ref 36.0–46.0)
Hemoglobin: 11.3 g/dL — ABNORMAL LOW (ref 12.0–15.0)
Immature Granulocytes: 0 %
Lymphocytes Relative: 19 %
Lymphs Abs: 1.2 10*3/uL (ref 0.7–4.0)
MCH: 29.9 pg (ref 26.0–34.0)
MCHC: 32.7 g/dL (ref 30.0–36.0)
MCV: 91.5 fL (ref 80.0–100.0)
Monocytes Absolute: 1 10*3/uL (ref 0.1–1.0)
Monocytes Relative: 16 %
Neutro Abs: 3.8 10*3/uL (ref 1.7–7.7)
Neutrophils Relative %: 60 %
Platelets: 301 10*3/uL (ref 150–400)
RBC: 3.78 MIL/uL — ABNORMAL LOW (ref 3.87–5.11)
RDW: 18.2 % — ABNORMAL HIGH (ref 11.5–15.5)
WBC: 6.3 10*3/uL (ref 4.0–10.5)
nRBC: 0 % (ref 0.0–0.2)

## 2023-09-06 MED ORDER — ACETAMINOPHEN 500 MG PO TABS
1000.0000 mg | ORAL_TABLET | ORAL | Status: AC
  Administered 2023-09-06: 1000 mg via ORAL
  Filled 2023-09-06: qty 2

## 2023-09-06 MED ORDER — LIDOCAINE 5 % EX PTCH
1.0000 | MEDICATED_PATCH | CUTANEOUS | Status: DC
  Administered 2023-09-06: 1 via TRANSDERMAL
  Filled 2023-09-06: qty 1

## 2023-09-06 NOTE — ED Provider Notes (Signed)
 [place] [facility] AT [facility] Provider Note   CSN: [id number] Arrival date & time: 09/06/23  4098     History  Chief Complaint  Patient presents with   Hip Pain    Traci Powell is a 83 y.o. female.  83 year old female with a history of Alzheimer's dementia and chronic bilateral knee pain with resultant ambulatory dysfunction who presents to the emergency department due to abnormal x-ray of the right hip.  Patient comes from [facility].  On 4/11 family mentioned that she was complaining of some hip pain.  Had an x-ray done recently that showed that she might have an impacted right femoral neck fracture.  Is wheelchair-bound.  Patient not complaining of any pain to me.  No noted falls.  History obtained per [facility] health rehab and patient.       Home Medications Prior to Admission medications   Medication Sig Start Date End Date Taking? Authorizing Provider  acetaminophen  (TYLENOL ) 500 MG tablet Take 1,000 mg by mouth in the morning and at bedtime.    [provider]  amLODipine (NORVASC) 5 MG tablet Take 5 mg by mouth daily. 06/15/22   [provider]  diclofenac Sodium (VOLTAREN) 1 % GEL Apply 3 g topically in the morning and at bedtime.    [provider]  divalproex  (DEPAKOTE  ER) 250 MG 24 hr tablet Take 250 mg by mouth 2 (two) times daily.    [provider]  docusate sodium (COLACE) 100 MG capsule Take 100 mg by mouth at bedtime.    [provider]  ferrous sulfate  325 (65 FE) MG EC tablet Take 1 tablet (325 mg total) by mouth daily with breakfast. 06/22/23   Heilingoetter, Cassandra L, PA-C  hydrALAZINE  (APRESOLINE ) 10 MG tablet Take 10 mg by mouth 2 (two) times daily. 06/02/22   [provider]  hydrOXYzine (ATARAX) 10 MG tablet Take 10 mg by mouth at bedtime. 06/02/22   [provider]  lisinopril  (ZESTRIL ) 20 MG tablet Take 20 mg by mouth daily.    [provider]   LORazepam  (ATIVAN ) 0.5 MG tablet Take 0.5 mg by mouth as needed for anxiety. When going to outside appointments    [provider]  melatonin 3 MG TABS tablet Take 10 mg by mouth at bedtime.    [provider]  memantine (NAMENDA) 10 MG tablet Take 10 mg by mouth 2 (two) times daily.    [provider]  Menthol, Topical Analgesic, (BIOFREEZE) 10 % CREA Apply 1 Application topically in the morning and at bedtime. Apply to knees    [provider]  ondansetron  (ZOFRAN ) 8 MG tablet Take 8 mg by mouth every 8 (eight) hours as needed for nausea or vomiting.    [provider]  pantoprazole  (PROTONIX ) 20 MG tablet Take 1 tablet (20 mg total) by mouth daily. 05/16/23   Schutt, Coni Deep, PA-C  pravastatin (PRAVACHOL) 10 MG tablet Take 10 mg by mouth daily.    [provider]  spironolactone (ALDACTONE) 50 MG tablet Take 50 mg by mouth 2 (two) times daily. 07/11/23   [provider]  traMADol (ULTRAM) 50 MG tablet Take 25 mg by mouth 4 (four) times daily.    [provider]  traZODone (DESYREL) 50 MG tablet Take 50 mg by mouth at bedtime. 04/24/22   [provider]      Allergies    Iodinated contrast media    Review of Systems  Review of Systems  Physical Exam Updated Vital Signs BP (!) 155/73 (BP Location: Left Arm)   Pulse 70   Temp 97.8 F (36.6 C) (Oral)   Resp 20   Ht 5\' 5"  (1.651 m)   Wt 60 kg   SpO2 99%   BMI 22.01 kg/m  Physical Exam Constitutional:      Appearance: Normal appearance.  HENT:     Head: Normocephalic and atraumatic.  Eyes:     Extraocular Movements: Extraocular movements intact.     Pupils: Pupils are equal, round, and reactive to light.  Cardiovascular:     Comments: DP pulses 2+ bilaterally in patient's lower extremities.  Both feet appear warm and well-perfused.  Cap refill less than 2 seconds in all 10 digits of the lower extremities. Musculoskeletal:     Comments: No  tenderness to palpation of bilateral hips, knees, or ankles.  Full range of motion of right hip and right knee.  Left hip and left knee range of motion limited due to left knee pain which she says is chronic.  No effusions of the joints.  No deformities.  Neurological:     Mental Status: She is alert.     ED Results / Procedures / Treatments   Labs (all labs ordered are listed, but only abnormal results are displayed) Labs Reviewed  CBC WITH DIFFERENTIAL/PLATELET - Abnormal; Notable for the following components:      Result Value   RBC 3.78 (*)    Hemoglobin 11.3 (*)    HCT 34.6 (*)    RDW 18.2 (*)    All other components within normal limits  BASIC METABOLIC PANEL WITH GFR - Abnormal; Notable for the following components:   BUN 27 (*)    All other components within normal limits    EKG None  Radiology CT Hip Right Wo Contrast Result Date: 09/06/2023 CLINICAL DATA:  Concern for right femoral neck fracture. EXAM: CT OF THE RIGHT HIP WITHOUT CONTRAST TECHNIQUE: Multidetector CT imaging of the right hip was performed according to the standard protocol. Multiplanar CT image reconstructions were also generated. RADIATION DOSE REDUCTION: This exam was performed according to the departmental dose-optimization program which includes automated exposure control, adjustment of the mA and/or kV according to patient size and/or use of iterative reconstruction technique. COMPARISON:  Right hip radiographs dated 09/06/2023. FINDINGS: Bones/Joint/Cartilage No acute fracture or dislocation. Severe degenerative changes of the right hip resulting in bone-on-bone contact superomedially with subchondral sclerosis and large bulky circumferential osteophytosis of the humeral head. The right sacroiliac joint and pubic symphysis are intact with mild-to-moderate degenerative changes. Degenerative changes of the visualized lower lumbar spine. Ligaments Ligaments are suboptimally evaluated by CT. Muscles and Tendons  Muscles are within normal limits. No intramuscular collection identified. Soft tissue No fluid collection or hematoma. Other: Postsurgical changes again noted in the lower rectum/anus. Aortoiliac atherosclerosis. IMPRESSION: 1. No acute osseous abnormality. 2. Severe degenerative changes of the right hip with bone-on-bone contact superomedially and large bulky circumferential osteophytosis of the humeral head. Electronically Signed   By: Mannie Seek M.D.   On: 09/06/2023 12:01   DG Hip Unilat W or Wo Pelvis 2-3 Views Right Result Date: 09/06/2023 CLINICAL DATA:  Right hip pain, possible fracture. EXAM: DG HIP (WITH OR WITHOUT PELVIS) 2-3V RIGHT COMPARISON:  Reformats from CT 06/13/2023 FINDINGS: Moderate to advanced osteoarthritis of the right hip with joint space narrowing, osteophytes arising from the femoral head neck junction. Band of sclerosis involving the right femoral neck,  potential impaction fracture. Advanced chronic left hip arthropathy. Pubic rami and bony pelvis are intact. Mild degenerative change of the sacroiliac joints. Chain sutures in the midline pelvis. IMPRESSION: 1. Suspected impaction fracture of the right femoral neck, although overlying osteophytes and degenerative change limit assessment. Recommend further assessment with CT or MRI. Moderate to advanced underlying right hip osteoarthritis. 2. Advanced left hip arthropathy. Electronically Signed   By: Chadwick Colonel M.D.   On: 09/06/2023 10:19    Procedures Procedures    Medications Ordered in ED Medications  lidocaine  (LIDODERM ) 5 % 1 patch (1 patch Transdermal Patch Applied 09/06/23 1305)  acetaminophen  (TYLENOL ) tablet 1,000 mg (1,000 mg Oral Given 09/06/23 1305)    ED Course/ Medical Decision Making/ A&P Clinical Course as of 09/06/23 1438  Tue Sep 06, 2023  0854 Dnr paperwork at bedside. Order placed.  [RP]    Clinical Course User Index [RP] Ninetta Basket, MD                                 Medical  Decision Making Amount and/or Complexity of Data Reviewed Labs: ordered. Radiology: ordered.  Risk OTC drugs. Prescription drug management.   Traci Powell is a 83 y.o. female with comorbidities that complicate the patient evaluation including Alzheimer's dementia and chronic bilateral knee pain with resultant ambulatory dysfunction who presents to the emergency department due to abnormal x-ray of the right hip.    Initial Ddx:  Osteoarthritis, hip fracture, septic joint, fall, hematoma  MDM/Course:  Patient presents emergency department with atraumatic right hip pain.  Had x-rays that were done at an outside facility that show possible impacted fracture.  No falls or trauma noted.  Does not have any external signs of trauma at all on my exam.  She has full range of motion of her hip on that side.  No joint effusion or warmth.  No other joint pain that she is describing to me.  She had x-rays here which upon my read appeared similar and so she underwent a CT scan which did not show evidence of fracture.  Upon re-evaluation patient was doing well.  Given Tylenol  and lidocaine  patch for her pain.  Will discharge her back to her facility with instructions to take these medications.  This patient presents to the ED for concern of complaints listed in HPI, this involves an extensive number of treatment options, and is a complaint that carries with it a high risk of complications and morbidity. Disposition including potential need for admission considered.   Dispo: DC Home. Return precautions discussed including, but not limited to, those listed in the AVS. Allowed pt time to ask questions which were answered fully prior to dc.  Records reviewed Outpatient Clinic Notes The following labs were independently interpreted: Chemistry and show no acute abnormality I independently reviewed the following imaging with scope of interpretation limited to determining acute life threatening conditions  related to emergency care: Extremity x-ray(s) and agree with the radiologist interpretation with the following exceptions: none I have reviewed the patients home medications and made adjustments as needed Social Determinants of health:  Geriatric  Portions of this note were generated with Scientist, clinical (histocompatibility and immunogenetics). Dictation errors may occur despite best attempts at proofreading.      Final Clinical Impression(s) / ED Diagnoses Final diagnoses:  Right hip pain  Osteoarthritis of right hip, unspecified osteoarthritis type    Rx / DC Orders ED  Discharge Orders     None         Ninetta Basket, MD 09/06/23 1438

## 2023-09-06 NOTE — ED Notes (Signed)
 Family at bedside verbalized "Our mother is complaining of left hip pain not the right hip. She has been crying and praying since she's been here and she is not like that. She does not cry." Dr Efraim Grange made aware and will see pt shortly-family made aware.  "

## 2023-09-06 NOTE — ED Notes (Signed)
 Dr. Eloise Harman at bedside

## 2023-09-06 NOTE — ED Notes (Signed)
 Called [facility] for return trip to facility.

## 2023-09-06 NOTE — ED Notes (Signed)
 Gave report to [facility] and spoke to [place], Charity fundraiser.

## 2023-09-06 NOTE — Discharge Instructions (Signed)
 You were seen for hip pain in the emergency department.   At home, please use tylenol  and lidocaine  patches for the pain.    Check your MyChart online for the results of any tests that had not resulted by the time you left the emergency department.   Follow-up with your orthopedic doctor in 1 to 3 weeks regarding your visit.    Return immediately to the emergency department if you experience any of the following: worsening pain, or any other concerning symptoms.    Thank you for visiting our Emergency Department. It was a pleasure taking care of you today.

## 2023-09-06 NOTE — ED Notes (Signed)
 Patient appears awake, alert and relaxed. Breathing even and unlabored. Warm blankets provided.

## 2023-09-06 NOTE — ED Notes (Signed)
 Paperworks for [facility] completed. MOST form paper given to [facility].

## 2023-09-06 NOTE — ED Triage Notes (Signed)
 BIB [facility] for right hip pain from [facility]. Patient appears alert to self but not to situation or place. Has history of dementia. Facility did xray yesterday and showed right femoral head fracture, no history of fall or trauma. Patient walks independently. Breathing even and unlabored.

## 2023-09-06 NOTE — ED Notes (Signed)
 [facility] at bedside. Patient appears awake and alert. Breathing even and unlabored.

## 2023-09-14 ENCOUNTER — Ambulatory Visit (INDEPENDENT_AMBULATORY_CARE_PROVIDER_SITE_OTHER): Admitting: Physician Assistant

## 2023-09-14 ENCOUNTER — Encounter: Payer: Self-pay | Admitting: Physician Assistant

## 2023-09-14 ENCOUNTER — Other Ambulatory Visit: Payer: Self-pay

## 2023-09-14 DIAGNOSIS — M1612 Unilateral primary osteoarthritis, left hip: Secondary | ICD-10-CM | POA: Diagnosis not present

## 2023-09-14 DIAGNOSIS — M25551 Pain in right hip: Secondary | ICD-10-CM

## 2023-09-14 DIAGNOSIS — M1611 Unilateral primary osteoarthritis, right hip: Secondary | ICD-10-CM | POA: Diagnosis not present

## 2023-09-14 MED ORDER — METHYLPREDNISOLONE ACETATE 40 MG/ML IJ SUSP
40.0000 mg | INTRAMUSCULAR | Status: AC | PRN
  Administered 2023-09-14: 40 mg via INTRA_ARTICULAR

## 2023-09-14 MED ORDER — LIDOCAINE HCL 1 % IJ SOLN
5.0000 mL | INTRAMUSCULAR | Status: AC | PRN
  Administered 2023-09-14: 5 mL

## 2023-09-14 MED ORDER — BUPIVACAINE HCL 0.25 % IJ SOLN
4.0000 mL | INTRAMUSCULAR | Status: AC | PRN
Start: 2023-09-14 — End: 2023-09-14
  Administered 2023-09-14: 4 mL via INTRA_ARTICULAR

## 2023-09-14 NOTE — Progress Notes (Signed)
 Office Visit Note   Patient: Traci Powell           Date of Birth: 1941/02/18           MRN: [id number] Visit Date: 09/14/2023              Requested by: Hoover Luz, MD [address] [place],  [place] [address] PCP: Hoover Luz, MD   Assessment & Plan: Visit Diagnoses:  1. Unilateral primary osteoarthritis, right hip   2. Pain in right hip   3. Unilateral primary osteoarthritis, left hip     Plan: Impression is advanced bilateral hip osteoarthritis.  Due to the patient's age, multiple comorbidities and decreased mobility, we have discussed cortisone injection versus medicine management as she is not a candidate for total hip replacement.  She would like to proceed with intra-articular cortisone injection to the left more symptomatic hip today.  She may follow-up for right hip injection if she would like.  She will follow-up with us  as needed.  Call with concerns or questions.  Follow-Up Instructions: Return if symptoms worsen or fail to improve.   Orders:  Orders Placed This Encounter  Procedures   Large Joint Inj: R hip joint   US  Guided Needle Placement - No Linked Charges   No orders of the defined types were placed in this encounter.     Procedures: Large Joint Inj: L hip joint on 09/14/2023 10:56 AM Medications: 5 mL lidocaine  1 %; 4 mL bupivacaine 0.25 %; 40 mg methylPREDNISolone acetate 40 MG/ML      Clinical Data: No additional findings.   Subjective: Chief Complaint  Patient presents with   Right Hip - Pain   Left Hip - Pain    HPI is a very pleasant 83 year old pleasantly demented female who is here today with her daughter.  History of bilateral hip osteoarthritis.  She is here from a SNF facility.  Daughter tells me she has been complaining of pain from the knee to the groin for about 3 months.  No specific injury that they can recall.  She notes that her mobility has decreased due to the pain as she has trouble with any  weightbearing.  Prior to the worsening of her symptoms, she was able to stand for transfers.  Facility has been giving her Tylenol  and tramadol without relief.  No previous cortisone injection to the right hip joint.  Of note, she was seen in the ED where x-rays and subsequent CT of the right hip were performed due to concern for hip fracture.  These were unremarkable for fracture but did show advanced arthritic change.  Review of Systems as detailed in HPI.  All others reviewed and are negative.   Objective: Vital Signs: There were no vitals taken for this visit.  Physical Exam well-developed well-nourished female no acute distress.  Alert and oriented x 3.  Ortho Exam bilateral hip exam: Minimal pain with logroll and FADIR testing.  She has very little rotation.  She is neurovascularly intact distally.  Specialty Comments:  No specialty comments available.  Imaging: No results found.   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  Past Surgical History:  Procedure Laterality Date   ABDOMINAL HYSTERECTOMY     COLON SURGERY  2007   resection W/ ileostomy and reversal   TRANSURETHRAL RESECTION OF BLADDER TUMOR WITH MITOMYCIN -C N/A 04/22/2022   Procedure: TRANSURETHRAL RESECTION OF  BLADDER TUMOR;  Surgeon: Homero Luster, MD;  Location: [facility];  Service: Urology;  Laterality: N/A;   Social History   Occupational History   Not on file  Tobacco Use   Smoking status: Former    Current packs/day: 0.00    Average packs/day: 0.3 packs/day for 5.0 years (1.3 ttl pk-yrs)    Types: Cigarettes    Start date: 04/20/1993    Quit date: 04/20/1998    Years since quitting: 25.4   Smokeless tobacco: Never  Substance and Sexual Activity   Alcohol use: Never   Drug use: Never   Sexual activity: Not on file

## 2023-12-20 ENCOUNTER — Ambulatory Visit (HOSPITAL_COMMUNITY)
Admission: RE | Admit: 2023-12-20 | Discharge: 2023-12-20 | Disposition: A | Discharge status: Home / Self Care | Source: Ambulatory Visit | Attending: Physician Assistant | Admitting: Physician Assistant

## 2023-12-20 ENCOUNTER — Inpatient Hospital Stay: Payer: Medicare PPO | Attending: Internal Medicine

## 2023-12-20 DIAGNOSIS — D649 Anemia, unspecified: Secondary | ICD-10-CM | POA: Insufficient documentation

## 2023-12-20 DIAGNOSIS — Z8551 Personal history of malignant neoplasm of bladder: Secondary | ICD-10-CM | POA: Diagnosis present

## 2023-12-20 DIAGNOSIS — C678 Malignant neoplasm of overlapping sites of bladder: Secondary | ICD-10-CM | POA: Diagnosis present

## 2023-12-20 DIAGNOSIS — Z9221 Personal history of antineoplastic chemotherapy: Secondary | ICD-10-CM | POA: Diagnosis not present

## 2023-12-20 DIAGNOSIS — I2510 Atherosclerotic heart disease of native coronary artery without angina pectoris: Secondary | ICD-10-CM | POA: Insufficient documentation

## 2023-12-20 DIAGNOSIS — N210 Calculus in bladder: Secondary | ICD-10-CM | POA: Insufficient documentation

## 2023-12-20 DIAGNOSIS — Z923 Personal history of irradiation: Secondary | ICD-10-CM | POA: Insufficient documentation

## 2023-12-20 DIAGNOSIS — I700 Atherosclerosis of aorta: Secondary | ICD-10-CM | POA: Diagnosis not present

## 2023-12-20 DIAGNOSIS — K5900 Constipation, unspecified: Secondary | ICD-10-CM | POA: Diagnosis not present

## 2023-12-20 DIAGNOSIS — R918 Other nonspecific abnormal finding of lung field: Secondary | ICD-10-CM | POA: Diagnosis not present

## 2023-12-20 LAB — CBC WITH DIFFERENTIAL (CANCER CENTER ONLY)
Abs Immature Granulocytes: 0.03 K/uL (ref 0.00–0.07)
Basophils Absolute: 0 K/uL (ref 0.0–0.1)
Basophils Relative: 0 %
Eosinophils Absolute: 0.2 K/uL (ref 0.0–0.5)
Eosinophils Relative: 3 %
HCT: 28.7 % — ABNORMAL LOW (ref 36.0–46.0)
Hemoglobin: 9.9 g/dL — ABNORMAL LOW (ref 12.0–15.0)
Immature Granulocytes: 1 %
Lymphocytes Relative: 21 %
Lymphs Abs: 1.3 K/uL (ref 0.7–4.0)
MCH: 30 pg (ref 26.0–34.0)
MCHC: 34.5 g/dL (ref 30.0–36.0)
MCV: 87 fL (ref 80.0–100.0)
Monocytes Absolute: 0.8 K/uL (ref 0.1–1.0)
Monocytes Relative: 13 %
Neutro Abs: 4 K/uL (ref 1.7–7.7)
Neutrophils Relative %: 62 %
Platelet Count: 245 K/uL (ref 150–400)
RBC: 3.3 MIL/uL — ABNORMAL LOW (ref 3.87–5.11)
RDW: 17.3 % — ABNORMAL HIGH (ref 11.5–15.5)
WBC Count: 6.4 K/uL (ref 4.0–10.5)
nRBC: 0 % (ref 0.0–0.2)

## 2023-12-20 LAB — CMP (CANCER CENTER ONLY)
ALT: 10 U/L (ref 0–44)
AST: 15 U/L (ref 15–41)
Albumin: 4.1 g/dL (ref 3.5–5.0)
Alkaline Phosphatase: 54 U/L (ref 38–126)
Anion gap: 4 — ABNORMAL LOW (ref 5–15)
BUN: 29 mg/dL — ABNORMAL HIGH (ref 8–23)
CO2: 31 mmol/L (ref 22–32)
Calcium: 10.3 mg/dL (ref 8.9–10.3)
Chloride: 107 mmol/L (ref 98–111)
Creatinine: 1.23 mg/dL — ABNORMAL HIGH (ref 0.44–1.00)
GFR, Estimated: 44 mL/min — ABNORMAL LOW (ref >60)
Glucose, Bld: 103 mg/dL — ABNORMAL HIGH (ref 70–99)
Potassium: 4.3 mmol/L (ref 3.5–5.1)
Sodium: 142 mmol/L (ref 135–145)
Total Bilirubin: 0.3 mg/dL (ref 0.0–1.2)
Total Protein: 7.2 g/dL (ref 6.5–8.1)

## 2023-12-20 LAB — SAMPLE TO BLOOD BANK

## 2023-12-20 LAB — FERRITIN: Ferritin: 33 ng/mL (ref 11–307)

## 2023-12-20 LAB — IRON AND IRON BINDING CAPACITY (CC-WL,HP ONLY)
Iron: 103 ug/dL (ref 28–170)
Saturation Ratios: 28 % (ref 10.4–31.8)
TIBC: 368 ug/dL (ref 250–450)
UIBC: 265 ug/dL (ref 148–442)

## 2023-12-27 ENCOUNTER — Telehealth: Payer: Self-pay | Admitting: Medical Oncology

## 2023-12-27 ENCOUNTER — Inpatient Hospital Stay (HOSPITAL_BASED_OUTPATIENT_CLINIC_OR_DEPARTMENT_OTHER): Payer: Medicare PPO | Admitting: Internal Medicine

## 2023-12-27 VITALS — BP 140/60 | HR 66 | Temp 97.9°F | Resp 16 | Ht 60.0 in | Wt 142.0 lb

## 2023-12-27 DIAGNOSIS — Z8551 Personal history of malignant neoplasm of bladder: Secondary | ICD-10-CM | POA: Diagnosis not present

## 2023-12-27 DIAGNOSIS — C678 Malignant neoplasm of overlapping sites of bladder: Secondary | ICD-10-CM

## 2023-12-27 NOTE — Telephone Encounter (Signed)
 LVM on both dtrs phone to return my call to see if one of them can be on the phone during pts appointment today with Dr. Sherrod.

## 2023-12-27 NOTE — Progress Notes (Signed)
 Med reconciliation- Pt cannot state medications due to dementia. All medications compared and reconciled with  [facility].

## 2023-12-27 NOTE — Addendum Note (Signed)
 Addended by: CAROLEE LOA DEL on: 12/27/2023 11:23 AM   Modules accepted: Orders

## 2023-12-27 NOTE — Progress Notes (Signed)
 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 The patient is not a good surgical candidate for bladder resection.    PRIOR THERAPY: Current chemoradiation with gemcitabine  at a lower dose of 27 Mg/M2 on days 1, 4, 8, 11, 15, 18, 22 and 25 for the first cycle and similar treatment for the second cycle until day 18.  Last dose on 07/12/22      CURRENT THERAPY: Observation   INTERVAL HISTORY: Traci Powell 83 y.o. female returns to the clinic today for follow-up visit.  Discussed the use of AI scribe software for clinical note transcription with the patient, who gave verbal consent to proceed.  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 She is accompanied by a caregiver from the facility where she resides and later joined by her daughter.  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 She underwent a transurethral resection of the prostate (TURP) and completed a course of chemoradiation with low dose gemcitabine , with the last dose administered on July 12, 2022. Since then, she has been under observation.  The scan was performed last week, but she does not recall having it done. She also does not remember her previous cancer diagnosis or treatment.  No chest pain, shortness of breath, or cough.     MEDICAL HISTORY: Past Medical History:  Diagnosis Date   Alzheimer's dementia [facility])    Cervical stenosis of spine    CKD (chronic kidney disease)    Gait disorder    Insomnia    Personal history of COVID-19 08/2020     ALLERGIES:  is allergic to iodinated contrast media.  MEDICATIONS:  Current Outpatient Medications  Medication Sig Dispense Refill   acetaminophen  (TYLENOL ) 500 MG tablet Take 1,000 mg by mouth in the morning and at bedtime.     amLODipine (NORVASC) 5 MG tablet Take 5 mg by mouth daily.     diclofenac Sodium (VOLTAREN) 1 % GEL Apply 3 g topically in the morning and at bedtime.     divalproex  (DEPAKOTE  ER) 250 MG 24 hr tablet Take 250 mg by mouth 2 (two) times daily.     docusate sodium (COLACE) 100 MG capsule Take 100 mg by mouth at bedtime.     ferrous sulfate  325 (65 FE) MG EC tablet Take 1 tablet (325 mg total) by mouth daily with breakfast. 30 tablet 3   hydrALAZINE  (APRESOLINE ) 10 MG tablet Take 10 mg by mouth 2 (two) times daily.     hydrOXYzine (ATARAX) 10 MG tablet Take 10 mg by mouth at bedtime.     lisinopril  (ZESTRIL ) 20 MG tablet Take 20 mg by mouth daily.     LORazepam  (ATIVAN ) 0.5 MG tablet Take 0.5 mg by mouth as needed for anxiety. When going to outside appointments     melatonin 3 MG TABS tablet Take 10 mg by mouth at bedtime.     memantine (NAMENDA) 10 MG tablet Take 10 mg by mouth 2 (two) times daily.     Menthol, Topical Analgesic, (BIOFREEZE) 10 %  CREA Apply 1 Application topically in the morning and at bedtime. Apply to knees     ondansetron  (ZOFRAN ) 8 MG tablet Take 8 mg by mouth every 8 (eight) hours as needed for nausea or vomiting.     pantoprazole  (PROTONIX ) 20 MG tablet Take 1 tablet (20 mg total) by mouth daily. 30 tablet 0   pravastatin (PRAVACHOL) 10 MG tablet Take 10 mg by mouth daily.     spironolactone (ALDACTONE) 50 MG tablet Take 50 mg by mouth 2 (two) times daily.     traMADol (ULTRAM) 50 MG tablet Take 25 mg by mouth 4 (four) times daily.     traZODone (DESYREL) 50 MG tablet Take 50 mg by mouth at bedtime.     No current facility-administered medications for this visit.   Facility-Administered Medications Ordered in Other Visits  Medication Dose  Route Frequency Provider Last Rate Last Admin   0.9 %  sodium chloride  infusion   Intravenous Once Sherrod Sherrod, MD        SURGICAL HISTORY:  Past Surgical History:  Procedure Laterality Date   ABDOMINAL HYSTERECTOMY     COLON SURGERY  2007   resection W/ ileostomy and reversal   TRANSURETHRAL RESECTION OF BLADDER TUMOR WITH MITOMYCIN -C N/A 04/22/2022   Procedure: TRANSURETHRAL RESECTION OF BLADDER TUMOR;  Surgeon: Watt Rush, MD;  Location: [facility];  Service: Urology;  Laterality: N/A;    REVIEW OF SYSTEMS:  Constitutional: negative Eyes: negative Ears, nose, mouth, throat, and face: negative Respiratory: negative Cardiovascular: negative Gastrointestinal: negative Genitourinary:negative Integument/breast: negative Hematologic/lymphatic: negative Musculoskeletal:negative Neurological: positive for memory problems Behavioral/Psych: negative Endocrine: negative Allergic/Immunologic: negative   PHYSICAL EXAMINATION: General appearance: alert and no distress Head: Normocephalic, without obvious abnormality, atraumatic Neck: no adenopathy, no JVD, supple, symmetrical, trachea midline, and thyroid not enlarged, symmetric, no tenderness/mass/nodules Lymph nodes: Cervical, supraclavicular, and axillary nodes normal. Resp: clear to auscultation bilaterally Back: symmetric, no curvature. ROM normal. No CVA tenderness. Cardio: regular rate and rhythm, S1, S2 normal, no murmur, click, rub or gallop GI: soft, non-tender; bowel sounds normal; no masses,  no organomegaly Extremities: extremities normal, atraumatic, no cyanosis or edema Neurologic: Alert and oriented X 3, normal strength and tone. Normal symmetric reflexes. Normal coordination and gait  ECOG PERFORMANCE STATUS: 1 - Symptomatic but completely ambulatory  Blood pressure (!) 140/60, pulse 66, temperature 97.9 F (36.6 C), temperature source Temporal, resp. rate 16, height 5' (1.524 m), SpO2 100%.  LABORATORY DATA: Lab  Results  Component Value Date   WBC 6.4 12/20/2023   HGB 9.9 (L) 12/20/2023   HCT 28.7 (L) 12/20/2023   MCV 87.0 12/20/2023   PLT 245 12/20/2023      Chemistry      Component Value Date/Time   NA 142 12/20/2023 0953   K 4.3 12/20/2023 0953   CL 107 12/20/2023 0953   CO2 31 12/20/2023 0953   BUN 29 (H) 12/20/2023 0953   CREATININE 1.23 (H) 12/20/2023 0953      Component Value Date/Time   CALCIUM 10.3 12/20/2023 0953   ALKPHOS 54 12/20/2023 0953   AST 15 12/20/2023 0953   ALT 10 12/20/2023 0953   BILITOT 0.3 12/20/2023 0953       RADIOGRAPHIC STUDIES: No results found.  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 The patient  is not a good surgical candidate for bladder resection.  She is status post current chemoradiation with gemcitabine  at a lower dose of 27 Mg/M2 on days 1, 4, 8, 11, 15, 18, 22 and 25 for the first cycle and similar treatment for the second cycle until day 18.  Last dose on 07/12/22  She is currently on observation and had repeat CT scan of the chest, abdomen and pelvis performed recently.  The final report is still pending but on review of the scan images I did not see any clear evidence for disease recurrence or metastasis but I would wait for the final report for confirmation. Assessment and Plan Assessment & Plan Stage II urethral carcinoma of the bladder, post chemoradiation Stage II urethral carcinoma of the bladder, status post chemoradiation with low dose gemcitabine . Last treatment completed on July 12, 2022. Recent CT scan of the chest, abdomen, and pelvis performed for restaging. Preliminary review of images did not reveal any concerning findings, awaiting official radiologist report. Given her age and memory issues, the frequency of follow-up  visits is being reconsidered. - Await radiologist report on recent CT scan - If CT scan is clear, schedule follow-up in one year She was advised to call immediately if she has any concerning symptoms in the interval. The patient voices understanding of current disease status and treatment options and is in agreement with the current care plan.  All questions were answered. The patient knows to call the clinic with any problems, questions or concerns. We can certainly see the patient much sooner if necessary.  The total time spent in the appointment was 30 minutes including review of chart and various tests results, discussions about plan of care and coordination of care plan .   Disclaimer: This note was dictated with voice recognition software. Similar sounding words can inadvertently be transcribed and may not be corrected upon review.

## 2023-12-27 NOTE — Progress Notes (Signed)
 [facility] Telephone:(336) [phone]   Fax:(336) [phone]  OFFICE PROGRESS NOTE  [name] LABOR, MD [address] 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 The patient is not a good surgical candidate for bladder resection.    PRIOR THERAPY: Current chemoradiation with gemcitabine  at a lower dose of 27 Mg/M2 on days 1, 4, 8, 11, 15, 18, 22 and 25 for the first cycle and similar treatment for the second cycle until day 18.  Last dose on 07/12/22      CURRENT THERAPY: Observation   INTERVAL HISTORY: Traci Powell 83 y.o. female returns to the clinic today for follow-up visit.  Discussed the use of AI scribe software for clinical note transcription with the patient, who gave verbal consent to proceed.  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 She is accompanied by a caregiver from the facility where she resides and later joined by her daughter.  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 She underwent a transurethral resection of the prostate (TURP) and completed a course of chemoradiation with low dose gemcitabine , with the last dose administered on July 12, 2022. Since then, she has been under observation.  The scan was performed last week, but she does not recall having it done. She also does not remember her previous cancer diagnosis or treatment.  No chest pain, shortness of breath, or cough.     MEDICAL HISTORY: Past Medical History:  Diagnosis Date   Alzheimer's dementia [facility])    Cervical stenosis of spine    CKD (chronic kidney disease)    Gait disorder    Insomnia    Personal history of COVID-19 08/2020     ALLERGIES:  is allergic to iodinated contrast media.  MEDICATIONS:  Current Outpatient Medications  Medication Sig Dispense Refill   acetaminophen  (TYLENOL ) 500 MG tablet Take 1,000 mg by mouth in the morning and at bedtime.     amLODipine (NORVASC) 5 MG tablet Take 5 mg by mouth daily.     diclofenac Sodium (VOLTAREN) 1 % GEL Apply 3 g topically in the morning and at bedtime.     divalproex  (DEPAKOTE  ER) 250 MG 24 hr tablet Take 250 mg by mouth 2 (two) times daily.     docusate sodium (COLACE) 100 MG capsule Take 100 mg by mouth at bedtime.     ferrous sulfate  325 (65 FE) MG EC tablet Take 1 tablet (325 mg total) by mouth daily with breakfast. 30 tablet 3   hydrALAZINE  (APRESOLINE ) 10 MG tablet Take 10 mg by mouth 2 (two) times daily.     hydrOXYzine (ATARAX) 10 MG tablet Take 10 mg by mouth at bedtime.     lisinopril  (ZESTRIL ) 20 MG tablet Take 20 mg by mouth daily.     LORazepam  (ATIVAN ) 0.5 MG tablet Take 0.5 mg by mouth as needed for anxiety. When going to outside appointments     melatonin 3 MG TABS tablet Take 10 mg by mouth at bedtime.     memantine (NAMENDA) 10 MG tablet Take 10 mg by mouth 2 (two) times daily.     Menthol, Topical Analgesic, (BIOFREEZE) 10 %  CREA Apply 1 Application topically in the morning and at bedtime. Apply to knees     ondansetron  (ZOFRAN ) 8 MG tablet Take 8 mg by mouth every 8 (eight) hours as needed for nausea or vomiting.     pantoprazole  (PROTONIX ) 20 MG tablet Take 1 tablet (20 mg total) by mouth daily. 30 tablet 0   pravastatin (PRAVACHOL) 10 MG tablet Take 10 mg by mouth daily.     spironolactone (ALDACTONE) 50 MG tablet Take 50 mg by mouth 2 (two) times daily.     traMADol (ULTRAM) 50 MG tablet Take 25 mg by mouth 4 (four) times daily.     traZODone (DESYREL) 50 MG tablet Take 50 mg by mouth at bedtime.     No current facility-administered medications for this visit.   Facility-Administered Medications Ordered in Other Visits  Medication Dose  Route Frequency Provider Last Rate Last Admin   0.9 %  sodium chloride  infusion   Intravenous Once Sherrod Sherrod, MD        SURGICAL HISTORY:  Past Surgical History:  Procedure Laterality Date   ABDOMINAL HYSTERECTOMY     COLON SURGERY  2007   resection W/ ileostomy and reversal   TRANSURETHRAL RESECTION OF BLADDER TUMOR WITH MITOMYCIN -C N/A 04/22/2022   Procedure: TRANSURETHRAL RESECTION OF BLADDER TUMOR;  Surgeon: Watt Rush, MD;  Location: [facility];  Service: Urology;  Laterality: N/A;    REVIEW OF SYSTEMS:  Constitutional: negative Eyes: negative Ears, nose, mouth, throat, and face: negative Respiratory: negative Cardiovascular: negative Gastrointestinal: negative Genitourinary:negative Integument/breast: negative Hematologic/lymphatic: negative Musculoskeletal:negative Neurological: positive for memory problems Behavioral/Psych: negative Endocrine: negative Allergic/Immunologic: negative   PHYSICAL EXAMINATION: General appearance: alert and no distress Head: Normocephalic, without obvious abnormality, atraumatic Neck: no adenopathy, no JVD, supple, symmetrical, trachea midline, and thyroid not enlarged, symmetric, no tenderness/mass/nodules Lymph nodes: Cervical, supraclavicular, and axillary nodes normal. Resp: clear to auscultation bilaterally Back: symmetric, no curvature. ROM normal. No CVA tenderness. Cardio: regular rate and rhythm, S1, S2 normal, no murmur, click, rub or gallop GI: soft, non-tender; bowel sounds normal; no masses,  no organomegaly Extremities: extremities normal, atraumatic, no cyanosis or edema Neurologic: Alert and oriented X 3, normal strength and tone. Normal symmetric reflexes. Normal coordination and gait  ECOG PERFORMANCE STATUS: 1 - Symptomatic but completely ambulatory  Blood pressure (!) 140/60, pulse 66, temperature 97.9 F (36.6 C), temperature source Temporal, resp. rate 16, height 5' (1.524 m), SpO2 100%.  LABORATORY DATA: Lab  Results  Component Value Date   WBC 6.4 12/20/2023   HGB 9.9 (L) 12/20/2023   HCT 28.7 (L) 12/20/2023   MCV 87.0 12/20/2023   PLT 245 12/20/2023      Chemistry      Component Value Date/Time   NA 142 12/20/2023 0953   K 4.3 12/20/2023 0953   CL 107 12/20/2023 0953   CO2 31 12/20/2023 0953   BUN 29 (H) 12/20/2023 0953   CREATININE 1.23 (H) 12/20/2023 0953      Component Value Date/Time   CALCIUM 10.3 12/20/2023 0953   ALKPHOS 54 12/20/2023 0953   AST 15 12/20/2023 0953   ALT 10 12/20/2023 0953   BILITOT 0.3 12/20/2023 0953       RADIOGRAPHIC STUDIES: No results found.  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 The patient  is not a good surgical candidate for bladder resection.  She is status post current chemoradiation with gemcitabine  at a lower dose of 27 Mg/M2 on days 1, 4, 8, 11, 15, 18, 22 and 25 for the first cycle and similar treatment for the second cycle until day 18.  Last dose on 07/12/22  She is currently on observation and had repeat CT scan of the chest, abdomen and pelvis performed recently.  The final report is still pending but on review of the scan images I did not see any clear evidence for disease recurrence or metastasis but I would wait for the final report for confirmation. Assessment and Plan Assessment & Plan Stage II urethral carcinoma of the bladder, post chemoradiation Stage II urethral carcinoma of the bladder, status post chemoradiation with low dose gemcitabine . Last treatment completed on July 12, 2022. Recent CT scan of the chest, abdomen, and pelvis performed for restaging. Preliminary review of images did not reveal any concerning findings, awaiting official radiologist report. Given her age and memory issues, the frequency of follow-up  visits is being reconsidered. - Await radiologist report on recent CT scan - If CT scan is clear, schedule follow-up in one year She was advised to call immediately if she has any concerning symptoms in the interval. The patient voices understanding of current disease status and treatment options and is in agreement with the current care plan.  All questions were answered. The patient knows to call the clinic with any problems, questions or concerns. We can certainly see the patient much sooner if necessary.  The total time spent in the appointment was 30 minutes including review of chart and various tests results, discussions about plan of care and coordination of care plan .   Disclaimer: This note was dictated with voice recognition software. Similar sounding words can inadvertently be transcribed and may not be corrected upon review.

## 2023-12-29 ENCOUNTER — Telehealth: Payer: Self-pay | Admitting: Internal Medicine

## 2023-12-29 NOTE — Telephone Encounter (Signed)
 Scheduled appointments with Rehab Facility around CT scan expected date.

## 2023-12-30 ENCOUNTER — Other Ambulatory Visit: Payer: Self-pay

## 2024-12-18 ENCOUNTER — Other Ambulatory Visit

## 2024-12-25 ENCOUNTER — Ambulatory Visit: Admitting: Internal Medicine
